# Patient Record
Sex: Male | Born: 1960
Health system: Southern US, Community
[De-identification: ages and names within clinical notes are randomized; demographics above are authoritative.]

## PROBLEM LIST (undated history)

## (undated) DIAGNOSIS — D689 Coagulation defect, unspecified: Secondary | ICD-10-CM

## (undated) DIAGNOSIS — G039 Meningitis, unspecified: Secondary | ICD-10-CM

## (undated) DIAGNOSIS — R011 Cardiac murmur, unspecified: Secondary | ICD-10-CM

## (undated) DIAGNOSIS — I10 Essential (primary) hypertension: Secondary | ICD-10-CM

## (undated) DIAGNOSIS — Q210 Ventricular septal defect: Secondary | ICD-10-CM

## (undated) HISTORY — DX: Cardiac murmur, unspecified: R01.1

## (undated) HISTORY — DX: Coagulation defect, unspecified: D68.9

## (undated) HISTORY — DX: Meningitis, unspecified: G03.9

---

## 2001-03-07 ENCOUNTER — Emergency Department (HOSPITAL_COMMUNITY): Admission: EM | Admit: 2001-03-07 | Discharge: 2001-03-07 | Payer: Self-pay | Admitting: Emergency Medicine

## 2001-11-15 ENCOUNTER — Emergency Department (HOSPITAL_COMMUNITY): Admission: EM | Admit: 2001-11-15 | Discharge: 2001-11-15 | Payer: Self-pay | Admitting: *Deleted

## 2004-05-02 ENCOUNTER — Ambulatory Visit: Payer: Self-pay | Admitting: Psychiatry

## 2005-03-25 ENCOUNTER — Ambulatory Visit: Payer: Self-pay | Admitting: Internal Medicine

## 2005-03-25 ENCOUNTER — Observation Stay (HOSPITAL_COMMUNITY): Admission: EM | Admit: 2005-03-25 | Discharge: 2005-03-26 | Payer: Self-pay | Admitting: Emergency Medicine

## 2010-04-01 ENCOUNTER — Emergency Department (HOSPITAL_COMMUNITY): Admission: EM | Admit: 2010-04-01 | Discharge: 2010-04-01 | Payer: Self-pay | Admitting: Emergency Medicine

## 2011-01-18 NOTE — Discharge Summary (Signed)
NAME:  Bruce [name], Bruce [name] NO.:  [id number]   MEDICAL RECORD NO.:  [id number]          PATIENT TYPE:  INP   LOCATION:  3735                         FACILITY:  [facility]   PHYSICIAN:  Yetta Barre, M.D.  DATE OF BIRTH:  Feb 06, 1961   DATE OF ADMISSION:  2005/04/14  DATE OF DISCHARGE:  03/26/2005                                 DISCHARGE SUMMARY   DISCHARGE DIAGNOSES:  1.  Chest pain to rule out acute myocardial infarction.  2.  Gastroesophageal reflux disease.  3.  Tobacco abuse.   DISCHARGE MEDICATIONS:  1.  Nexium 40 mg p.o. daily 1/2 hour before meals.  2.  Aspirin 325 mg p.o. daily.   DISPOSITION:  Hospital followup with patient's primary care physician, Dr.  Montey Hora on April 04, 2005 at 11:30 a.m.   CONSULTATIONS:  None this admission.   PROCEDURE:  None.   IMAGING STUDIES:  1.  CT of the chest on 04/14/2005. No evidence of pulmonary embolism or      dissection seen. Lungs clear.  2.  Chest x-ray done on 2005/04/14. No active disease seen.   HISTORY OF PRESENT ILLNESS:  This is a 50 year old Caucasian man with  ventricular septal defect and history of tobacco use for 30 years presenting  to the emergency department by EMS. Is complaining of chest pain since 1 day  prior to admission. The pain started around 10:00 a.m. in the morning and  was about 5 out of 10. A stabbing, sharp kind of pain, radiating to the left  side of the chest and to the left arm. The pain increased in intensity  overnight to about 8 to 9 on a scale of 10. The patient went to [facility] where he was given 2 nitroglycerin tablets  and EMS was called. On the way, he got another nitroglycerin sublingually.  By the time he got to [facility] emergency room, his pain had  decreased to just chest discomfort. On note, the patient has had similar  complaints in the past but none as severe.   ALLERGIES:  No known drug allergies.   PAST  MEDICAL HISTORY:  1.  VSD since birth.  2.  Meningitis when the patient was 50 years old.  3.  Endoscopy done in 2003 showing acid reflux disease. The patient was put      on Nexium then. However, the patient is non-compliant and does not take      the medication.   MEDICATIONS:  No home medications.   SOCIAL HISTORY:  The patient is a current smoker, smoking 2 packs per day  for the past 30 years. He does not drink alcohol and has no history of  illicit drug use. He is married and his education is to the 10th grade. He  was a public work Interior and spatial designer. He has [employer] and he lives with his  wife at home.   FAMILY HISTORY:  Mother is alive and healthy. Father is healthy. One brother  has lung cancer. He has 1 son and 1 daughter who died  as a preemie. Both his  grandmother and grandfather died of a heart attack.   REVIEW OF SYSTEMS:  The only thing that the patient has is joint pain.   PHYSICAL EXAMINATION:  VITAL SIGNS:  Temperature 98.3. Blood pressure  145/60. Pulse 75. Respiratory rate 16. O2 saturation is 95% on room air.  GENERAL:  The patient in no acute distress.  HEENT:  Eyes, pupils are equal, round, and reactive to light and  accommodation. Extraocular movements intact. Oropharynx clear. Moist mucous  membranes.  NECK:  Supple. No lymphadenopathy. Clear to auscultation bilaterally. No  wheezes or rhonchi.  HEART:  A holosystolic murmur was heard.  ABDOMEN:  Soft. No distention or tenderness. Bowel sounds are present.  EXTREMITIES:  No clubbing, cyanosis, or edema.  SKIN:  No rashes or petechia.  NEUROLOGIC:  The patient  was alert and oriented times three. Cranial nerves  2-12 are intact. Strength 5/5 bilaterally and symmetric. Tone normal and  bilaterally symmetric. Sensation is intact to pin prick. Finger to nose  finger test was normal and deep tendon reflexes 2+ bilaterally.   LABORATORY DATA:  Sodium 138, potassium 4.0, chloride 109, bicarb 24.1, BUN  12,  creatinine 0.8, glucose 132, hemoglobin 15.5, hematocrit 45.1, MCV 83.9.  White blood cell count 7.9. ANC 5.6, platelets 200,000. Urine drug screen  was negative for any substance. TSH was 1.837. Cardiac markers, the first  one, CK 100, CK MB 0.6, troponin 0.01. The next set of markers showed CK  101, CK MB 0.6, and troponin 0.01. Lipid profile:  Cholesterol 155, TG 161,  HDL 40, LDL 83, VLDL 32.   Electrocardiogram normal.   HOSPITAL COURSE BY PROBLEM:  1.  CHEST PAIN:  Chest pain, ruled out for acute myocardial infarction by      CK's and troponin being normal range and there were no changes on the      electrocardiogram. No chest pain since admission and there were no      changes on telemetry. CT of chest also showed no evidence of pulmonary      embolism or dissection and on x-ray, it was shown that there was no      active disease. The lungs were clear. After the initial workup, we      suspect that this may be gastrointestinal in origin, since the patient      has a history of gastroesophageal reflux disease by endoscopy and not on      any current treatment. The plan is to discharge the patient on a PPI,      that is Nexium and to followup with his primary care physician.   1.  GASTROESOPHAGEAL REFLUX DISEASE:  The patient was discharged on Nexium      40 mg p.o. daily.   1.  TOBACCO ABUSE:  A smoking cessation consultation was called and the      patient said that he was not interested in smoking cessation.   DISCHARGE LABORATORY DATA:  Sodium 139, potassium 4.1, chloride 107, bicarb  23, BUN 9, creatinine 0.9, glucose 87, white cell count 11.4, hemoglobin  15.2, hematocrit 45, and platelets 206,000.   DISCHARGE VITAL SIGNS:  Temperature 98.6, respiratory rate 20, pulse 62,  blood pressure 109/64. O2 saturation 97% on room air.       SS/MEDQ  D:  03/28/2005  T:  03/29/2005  Job:  [id number]   cc:   Montey Hora, M.D.  [place], [place]

## 2017-03-21 ENCOUNTER — Encounter: Payer: Self-pay | Admitting: Family

## 2017-03-21 ENCOUNTER — Encounter (INDEPENDENT_AMBULATORY_CARE_PROVIDER_SITE_OTHER): Payer: Self-pay

## 2017-03-21 ENCOUNTER — Ambulatory Visit (INDEPENDENT_AMBULATORY_CARE_PROVIDER_SITE_OTHER): Payer: BLUE CROSS/BLUE SHIELD | Admitting: Family

## 2017-03-21 VITALS — BP 156/88 | HR 66 | Temp 97.7°F | Ht 71.0 in | Wt 243.0 lb

## 2017-03-21 DIAGNOSIS — Z1211 Encounter for screening for malignant neoplasm of colon: Secondary | ICD-10-CM

## 2017-03-21 DIAGNOSIS — Z114 Encounter for screening for human immunodeficiency virus [HIV]: Secondary | ICD-10-CM

## 2017-03-21 DIAGNOSIS — F17200 Nicotine dependence, unspecified, uncomplicated: Secondary | ICD-10-CM | POA: Insufficient documentation

## 2017-03-21 DIAGNOSIS — Z1159 Encounter for screening for other viral diseases: Secondary | ICD-10-CM

## 2017-03-21 DIAGNOSIS — J019 Acute sinusitis, unspecified: Secondary | ICD-10-CM

## 2017-03-21 DIAGNOSIS — Z0000 Encounter for general adult medical examination without abnormal findings: Secondary | ICD-10-CM

## 2017-03-21 DIAGNOSIS — E669 Obesity, unspecified: Secondary | ICD-10-CM | POA: Insufficient documentation

## 2017-03-21 MED ORDER — AMOXICILLIN-POT CLAVULANATE 875-125 MG PO TABS: 1.0000 | ORAL_TABLET | Freq: Two times a day (BID) | ORAL | 0 refills | Status: DC

## 2017-03-21 NOTE — Progress Notes (Signed)
Subjective:    Patient ID: Bruce [name], male    DOB: 1961/02/01, 56 y.o.   MRN: [id number]  Pt presents to the office today to establish care and CPE. Pt is complaining of sinus pressure for over the last week.  Sinusitis  This is a new problem. The current episode started 1 to 4 weeks ago. The problem has been gradually worsening since onset. There has been no fever. The pain is mild. Associated symptoms include ear pain, headaches and sinus pressure. Pertinent negatives include no chills, congestion, coughing, sneezing or sore throat. Past treatments include oral decongestants. The treatment provided mild relief.     Review of Systems  Constitutional: Negative for chills.  HENT: Positive for ear pain and sinus pressure. Negative for congestion, sneezing and sore throat.   Respiratory: Negative for cough.   Neurological: Positive for headaches.  All other systems reviewed and are negative.  Social History   Social History  . Marital status: Married    Spouse name: N/A  . Number of children: N/A  . Years of education: N/A   Social History Main Topics  . Smoking status: Current Every Day Smoker  . Smokeless tobacco: Never Used  . Alcohol use No  . Drug use: No  . Sexual activity: Yes   Other Topics Concern  . None   Social History Narrative  . None    Family History  Problem Relation Age of Onset  . Arthritis Mother   . Hypertension Mother   . Hyperlipidemia Mother   . Cancer Mother        Brain tumor.  . Diabetes Mother   . Cancer Father   . Cancer Brother        Bone       Objective:   Physical Exam  Constitutional: He is oriented to person, place, and time. He appears well-developed and well-nourished. No distress.  HENT:  Head: Normocephalic.  Right Ear: External ear normal.  Left Ear: External ear normal.  Nose: Mucosal edema and rhinorrhea present. Right sinus exhibits frontal sinus tenderness. Left sinus exhibits frontal sinus tenderness.    Mouth/Throat: Posterior oropharyngeal erythema present.  Eyes: Pupils are equal, round, and reactive to light. Right eye exhibits no discharge. Left eye exhibits no discharge.  Neck: Normal range of motion. Neck supple. No thyromegaly present.  Cardiovascular: Normal rate, regular rhythm and intact distal pulses.   Murmur heard. Pulmonary/Chest: Effort normal and breath sounds normal. No respiratory distress. He has no wheezes.  Abdominal: Soft. Bowel sounds are normal. He exhibits no distension. There is no tenderness.  Musculoskeletal: Normal range of motion. He exhibits no edema or tenderness.  Neurological: He is alert and oriented to person, place, and time.  Skin: Skin is warm and dry. No rash noted. No erythema.  Psychiatric: He has a normal mood and affect. His behavior is normal. Judgment and thought content normal.  Vitals reviewed.    BP (!) 156/88   Pulse 66   Temp 97.7 F (36.5 C) (Oral)   Ht '5\' 11"'  (1.803 m)   Wt 243 lb (110.2 kg)   BMI 33.89 kg/m      Assessment & Plan:  1. Annual physical exam - Anemia Profile B - CMP14+EGFR - Lipid panel - TSH - VITAMIN D 25 Hydroxy (Vit-D Deficiency, Fractures) - Hepatitis C antibody - HIV antibody - PSA, total and free  2. Acute sinusitis, recurrence not specified, unspecified location - Take meds as prescribed - Use a  cool mist humidifier  -Use saline nose sprays frequently -Saline irrigations of the nose can be very helpful if done frequently.  * 4X daily for 1 week*  * Use of a nettie pot can be helpful with this. Follow directions with this* -Force fluids -For any cough or congestion  Use plain Mucinex- regular strength or max strength is fine   * Children- consult with Pharmacist for dosing -For fever or aces or pains- take tylenol or ibuprofen appropriate for age and weight.  * for fevers greater than 101 orally you may alternate ibuprofen and tylenol every  3 hours. -Throat lozenges if help -New toothbrush  in 3 days - CMP14+EGFR - amoxicillin-clavulanate (AUGMENTIN) 875-125 MG tablet; Take 1 tablet by mouth 2 (two) times daily.  Dispense: 14 tablet; Refill: 0  3. Current smoker - CMP14+EGFR  4. Obesity (BMI 30-39.9) - CMP14+EGFR  5. Need for hepatitis C screening test - CMP14+EGFR - Hepatitis C antibody  6. Encounter for screening for HIV - CMP14+EGFR - HIV antibody  7. Colon cancer screening - Fecal occult blood, imunochemical; Future   Continue all meds Labs pending Health Maintenance reviewed Diet and exercise encouraged RTO 1 year   Evelina Dun, FNP

## 2017-03-21 NOTE — Patient Instructions (Signed)

## 2017-03-22 LAB — LIPID PANEL
CHOLESTEROL TOTAL: 181 mg/dL (ref 100–199)
Chol/HDL Ratio: 4.5 ratio (ref 0.0–5.0)
HDL: 40 mg/dL (ref >39)
LDL Calculated: 90 mg/dL (ref 0–99)
Triglycerides: 253 mg/dL — ABNORMAL HIGH (ref 0–149)
VLDL Cholesterol Cal: 51 mg/dL — ABNORMAL HIGH (ref 5–40)

## 2017-03-22 LAB — CMP14+EGFR
A/G RATIO: 2 (ref 1.2–2.2)
ALBUMIN: 4.2 g/dL (ref 3.5–5.5)
ALK PHOS: 95 IU/L (ref 39–117)
ALT: 18 IU/L (ref 0–44)
AST: 17 IU/L (ref 0–40)
BILIRUBIN TOTAL: 0.2 mg/dL (ref 0.0–1.2)
BUN / CREAT RATIO: 11 (ref 9–20)
BUN: 10 mg/dL (ref 6–24)
CHLORIDE: 103 mmol/L (ref 96–106)
CO2: 25 mmol/L (ref 20–29)
Calcium: 9.1 mg/dL (ref 8.7–10.2)
Creatinine, Ser: 0.94 mg/dL (ref 0.76–1.27)
GFR calc non Af Amer: 91 mL/min/{1.73_m2} (ref >59)
GFR, EST AFRICAN AMERICAN: 105 mL/min/{1.73_m2} (ref >59)
GLOBULIN, TOTAL: 2.1 g/dL (ref 1.5–4.5)
Glucose: 100 mg/dL — ABNORMAL HIGH (ref 65–99)
Potassium: 4.1 mmol/L (ref 3.5–5.2)
SODIUM: 144 mmol/L (ref 134–144)
TOTAL PROTEIN: 6.3 g/dL (ref 6.0–8.5)

## 2017-03-22 LAB — ANEMIA PROFILE B
BASOS ABS: 0.1 10*3/uL (ref 0.0–0.2)
BASOS: 1 %
EOS (ABSOLUTE): 0.1 10*3/uL (ref 0.0–0.4)
EOS: 2 %
FERRITIN: 459 ng/mL — AB (ref 30–400)
FOLATE: 10.2 ng/mL (ref >3.0)
HEMATOCRIT: 46.5 % (ref 37.5–51.0)
Hemoglobin: 15.5 g/dL (ref 13.0–17.7)
IRON SATURATION: 33 % (ref 15–55)
IRON: 99 ug/dL (ref 38–169)
Immature Grans (Abs): 0 10*3/uL (ref 0.0–0.1)
Immature Granulocytes: 0 %
LYMPHS ABS: 2.5 10*3/uL (ref 0.7–3.1)
Lymphs: 32 %
MCH: 28.9 pg (ref 26.6–33.0)
MCHC: 33.3 g/dL (ref 31.5–35.7)
MCV: 87 fL (ref 79–97)
Monocytes Absolute: 0.6 10*3/uL (ref 0.1–0.9)
Monocytes: 7 %
NEUTROS ABS: 4.5 10*3/uL (ref 1.4–7.0)
Neutrophils: 58 %
PLATELETS: 182 10*3/uL (ref 150–379)
RBC: 5.36 x10E6/uL (ref 4.14–5.80)
RDW: 14.5 % (ref 12.3–15.4)
Retic Ct Pct: 2.1 % (ref 0.6–2.6)
TIBC: 301 ug/dL (ref 250–450)
UIBC: 202 ug/dL (ref 111–343)
VITAMIN B 12: 332 pg/mL (ref 232–1245)
WBC: 7.7 10*3/uL (ref 3.4–10.8)

## 2017-03-22 LAB — PSA, TOTAL AND FREE
PROSTATE SPECIFIC AG, SERUM: 0.4 ng/mL (ref 0.0–4.0)
PSA FREE: 0.13 ng/mL
PSA, Free Pct: 32.5 %

## 2017-03-22 LAB — VITAMIN D 25 HYDROXY (VIT D DEFICIENCY, FRACTURES): VIT D 25 HYDROXY: 28.8 ng/mL — AB (ref 30.0–100.0)

## 2017-03-22 LAB — TSH: TSH: 1.86 u[IU]/mL (ref 0.450–4.500)

## 2017-03-22 LAB — HIV ANTIBODY (ROUTINE TESTING W REFLEX): HIV Screen 4th Generation wRfx: NONREACTIVE

## 2017-03-22 LAB — HEPATITIS C ANTIBODY

## 2017-03-24 ENCOUNTER — Other Ambulatory Visit: Payer: Self-pay | Admitting: Family

## 2017-03-24 DIAGNOSIS — E559 Vitamin D deficiency, unspecified: Secondary | ICD-10-CM

## 2017-03-24 MED ORDER — VITAMIN D (ERGOCALCIFEROL) 1.25 MG (50000 UNIT) PO CAPS: 50000.0000 [IU] | ORAL_CAPSULE | ORAL | 3 refills | Status: DC

## 2017-04-01 ENCOUNTER — Telehealth: Payer: Self-pay | Admitting: Family

## 2017-04-02 NOTE — Telephone Encounter (Signed)
Pt's wife notified of lab results Verbalizes understanding 

## 2017-04-29 DIAGNOSIS — G8929 Other chronic pain: Secondary | ICD-10-CM | POA: Diagnosis not present

## 2017-04-29 DIAGNOSIS — M25562 Pain in left knee: Secondary | ICD-10-CM | POA: Diagnosis not present

## 2017-05-03 DIAGNOSIS — G8929 Other chronic pain: Secondary | ICD-10-CM | POA: Diagnosis not present

## 2017-05-03 DIAGNOSIS — M25562 Pain in left knee: Secondary | ICD-10-CM | POA: Diagnosis not present

## 2017-05-08 DIAGNOSIS — G8929 Other chronic pain: Secondary | ICD-10-CM | POA: Diagnosis not present

## 2017-05-08 DIAGNOSIS — M25562 Pain in left knee: Secondary | ICD-10-CM | POA: Diagnosis not present

## 2017-05-08 DIAGNOSIS — S83242A Other tear of medial meniscus, current injury, left knee, initial encounter: Secondary | ICD-10-CM | POA: Diagnosis not present

## 2017-07-01 ENCOUNTER — Ambulatory Visit (INDEPENDENT_AMBULATORY_CARE_PROVIDER_SITE_OTHER): Payer: BLUE CROSS/BLUE SHIELD | Admitting: Family Medicine

## 2017-07-01 VITALS — Temp 98.6°F | Ht 71.0 in | Wt 238.0 lb

## 2017-07-01 DIAGNOSIS — R030 Elevated blood-pressure reading, without diagnosis of hypertension: Secondary | ICD-10-CM

## 2017-07-01 DIAGNOSIS — Z024 Encounter for examination for driving license: Secondary | ICD-10-CM

## 2017-07-01 NOTE — Progress Notes (Signed)
Subjective:  Patient ID: Bruce [name], male    DOB: 04/07/1961  Age: 56 y.o. MRN: [id number]  CC: Private DOT Physical   HPI Bruce DickRichard K [name] presents for DOT physical evaluation  [facility] 07/01/2017 03/21/2017  Decreased Interest 0 0  Down, Depressed, Hopeless 0 0  PHQ - 2 Score 0 0    History Gergory has a past medical history of Clotting disorder (HCC); Heart murmur; and Meningitis.   He has no past surgical history on file.   His family history includes Arthritis in his mother; Cancer in his brother, father, and mother; Diabetes in his mother; Hyperlipidemia in his mother; Hypertension in his mother.He reports that he has been smoking.  He has never used smokeless tobacco. He reports that he does not drink alcohol or use drugs.    ROS Review of Systems  Constitutional: Negative for chills, diaphoresis, fever and unexpected weight change.  HENT: Negative for congestion, hearing loss, rhinorrhea and sore throat.   Eyes: Negative for visual disturbance.  Respiratory: Negative for cough and shortness of breath.   Cardiovascular: Negative for chest pain.  Gastrointestinal: Negative for abdominal pain, constipation and diarrhea.  Genitourinary: Negative for dysuria and flank pain.  Musculoskeletal: Negative for arthralgias and joint swelling.  Skin: Negative for rash.  Neurological: Negative for dizziness and headaches.  Psychiatric/Behavioral: Negative for dysphoric mood and sleep disturbance.    Objective:  Temp 98.6 F (37 C) (Oral)   Ht 5\' 11"  (1.803 m)   Wt 238 lb (108 kg)   BMI 33.19 kg/m   BP Readings from Last 3 Encounters:  03/21/17 (!) 156/88    Wt Readings from Last 3 Encounters:  07/01/17 238 lb (108 kg)  03/21/17 243 lb (110.2 kg)     Physical Exam  Constitutional: He is oriented to person, place, and time. He appears well-developed and well-nourished. No distress.  HENT:  Head: Normocephalic and atraumatic.  Right Ear:  External ear normal.  Left Ear: External ear normal.  Nose: Nose normal.  Mouth/Throat: Oropharynx is clear and moist.  Eyes: Pupils are equal, round, and reactive to light. Conjunctivae and EOM are normal.  Neck: Normal range of motion. Neck supple. No thyromegaly present.  Cardiovascular: Normal rate, regular rhythm and normal heart sounds.   No murmur heard. Pulmonary/Chest: Effort normal and breath sounds normal. No respiratory distress. He has no wheezes. He has no rales.  Abdominal: Soft. Bowel sounds are normal. He exhibits no distension. There is no tenderness.  Lymphadenopathy:    He has no cervical adenopathy.  Neurological: He is alert and oriented to person, place, and time. He has normal reflexes.  Skin: Skin is warm and dry.  Psychiatric: He has a normal mood and affect. His behavior is normal. Judgment and thought content normal.      Assessment & Plan:   Bruce BurdockRichard was seen today for private dot physical.  Diagnoses and all orders for this visit:  Encounter for Department of Transportation (DOT) examination for driving license renewal  Elevated blood pressure reading       I have discontinued Mr. Winferd Humphreyucker's amoxicillin-clavulanate. I am also having him maintain his Vitamin D (Ergocalciferol).  Allergies as of 07/01/2017   No Known Allergies     Medication List       Accurate as of 07/01/17  5:06 PM. Always use your most recent med list.          Vitamin D (Ergocalciferol) 50000 units Caps capsule Commonly  known as:  DRISDOL Take 1 capsule (50,000 Units total) by mouth every 7 (seven) days.      DASH diet recommended. Weight loss program encouraged with ultimate goal of 40 pounds.  Follow-up: Return in about 4 weeks (around 07/29/2017) for hypertension with Marin Shutter.  Mechele Claude, M.D.

## 2017-07-01 NOTE — Patient Instructions (Signed)
DASH Eating Plan DASH stands for "Dietary Approaches to Stop Hypertension." The DASH eating plan is a healthy eating plan that has been shown to reduce high blood pressure (hypertension). It may also reduce your risk for type 2 diabetes, heart disease, and stroke. The DASH eating plan may also help with weight loss. What are tips for following this plan? General guidelines  Avoid eating more than 2,300 mg (milligrams) of salt (sodium) a day. If you have hypertension, you may need to reduce your sodium intake to 1,500 mg a day.  Limit alcohol intake to no more than 1 drink a day for nonpregnant women and 2 drinks a day for men. One drink equals 12 oz of beer, 5 oz of wine, or 1 oz of hard liquor.  Work with your health care provider to maintain a healthy body weight or to lose weight. Ask what an ideal weight is for you.  Get at least 30 minutes of exercise that causes your heart to beat faster (aerobic exercise) most days of the week. Activities may include walking, swimming, or biking.  Work with your health care provider or diet and nutrition specialist (dietitian) to adjust your eating plan to your individual calorie needs. Reading food labels  Check food labels for the amount of sodium per serving. Choose foods with less than 5 percent of the Daily Value of sodium. Generally, foods with less than 300 mg of sodium per serving fit into this eating plan.  To find whole grains, look for the word "whole" as the first word in the ingredient list. Shopping  Buy products labeled as "low-sodium" or "no salt added."  Buy fresh foods. Avoid canned foods and premade or frozen meals. Cooking  Avoid adding salt when cooking. Use salt-free seasonings or herbs instead of table salt or sea salt. Check with your health care provider or pharmacist before using salt substitutes.  Do not fry foods. Cook foods using healthy methods such as baking, boiling, grilling, and broiling instead.  Cook with  heart-healthy oils, such as olive, canola, soybean, or sunflower oil. Meal planning   Eat a balanced diet that includes: ? 5 or more servings of fruits and vegetables each day. At each meal, try to fill half of your plate with fruits and vegetables. ? Up to 6-8 servings of whole grains each day. ? Less than 6 oz of lean meat, poultry, or fish each day. A 3-oz serving of meat is about the same size as a deck of cards. One egg equals 1 oz. ? 2 servings of low-fat dairy each day. ? A serving of nuts, seeds, or beans 5 times each week. ? Heart-healthy fats. Healthy fats called Omega-3 fatty acids are found in foods such as flaxseeds and coldwater fish, like sardines, salmon, and mackerel.  Limit how much you eat of the following: ? Canned or prepackaged foods. ? Food that is high in trans fat, such as fried foods. ? Food that is high in saturated fat, such as fatty meat. ? Sweets, desserts, sugary drinks, and other foods with added sugar. ? Full-fat dairy products.  Do not salt foods before eating.  Try to eat at least 2 vegetarian meals each week.  Eat more home-cooked food and less restaurant, buffet, and fast food.  When eating at a restaurant, ask that your food be prepared with less salt or no salt, if possible. What foods are recommended? The items listed may not be a complete list. Talk with your dietitian about what   dietary choices are best for you. Grains Whole-grain or whole-wheat bread. Whole-grain or whole-wheat pasta. Brown rice. Oatmeal. Quinoa. Bulgur. Whole-grain and low-sodium cereals. Pita bread. Low-fat, low-sodium crackers. Whole-wheat flour tortillas. Vegetables Fresh or frozen vegetables (raw, steamed, roasted, or grilled). Low-sodium or reduced-sodium tomato and vegetable juice. Low-sodium or reduced-sodium tomato sauce and tomato paste. Low-sodium or reduced-sodium canned vegetables. Fruits All fresh, dried, or frozen fruit. Canned fruit in natural juice (without  added sugar). Meat and other protein foods Skinless chicken or turkey. Ground chicken or turkey. Pork with fat trimmed off. Fish and seafood. Egg whites. Dried beans, peas, or lentils. Unsalted nuts, nut butters, and seeds. Unsalted canned beans. Lean cuts of beef with fat trimmed off. Low-sodium, lean deli meat. Dairy Low-fat (1%) or fat-free (skim) milk. Fat-free, low-fat, or reduced-fat cheeses. Nonfat, low-sodium ricotta or cottage cheese. Low-fat or nonfat yogurt. Low-fat, low-sodium cheese. Fats and oils Soft margarine without trans fats. Vegetable oil. Low-fat, reduced-fat, or light mayonnaise and salad dressings (reduced-sodium). Canola, safflower, olive, soybean, and sunflower oils. Avocado. Seasoning and other foods Herbs. Spices. Seasoning mixes without salt. Unsalted popcorn and pretzels. Fat-free sweets. What foods are not recommended? The items listed may not be a complete list. Talk with your dietitian about what dietary choices are best for you. Grains Baked goods made with fat, such as croissants, muffins, or some breads. Dry pasta or rice meal packs. Vegetables Creamed or fried vegetables. Vegetables in a cheese sauce. Regular canned vegetables (not low-sodium or reduced-sodium). Regular canned tomato sauce and paste (not low-sodium or reduced-sodium). Regular tomato and vegetable juice (not low-sodium or reduced-sodium). Pickles. Olives. Fruits Canned fruit in a light or heavy syrup. Fried fruit. Fruit in cream or butter sauce. Meat and other protein foods Fatty cuts of meat. Ribs. Fried meat. Bacon. Sausage. Bologna and other processed lunch meats. Salami. Fatback. Hotdogs. Bratwurst. Salted nuts and seeds. Canned beans with added salt. Canned or smoked fish. Whole eggs or egg yolks. Chicken or turkey with skin. Dairy Whole or 2% milk, cream, and half-and-half. Whole or full-fat cream cheese. Whole-fat or sweetened yogurt. Full-fat cheese. Nondairy creamers. Whipped toppings.  Processed cheese and cheese spreads. Fats and oils Butter. Stick margarine. Lard. Shortening. Ghee. Bacon fat. Tropical oils, such as coconut, palm kernel, or palm oil. Seasoning and other foods Salted popcorn and pretzels. Onion salt, garlic salt, seasoned salt, table salt, and sea salt. Worcestershire sauce. Tartar sauce. Barbecue sauce. Teriyaki sauce. Soy sauce, including reduced-sodium. Steak sauce. Canned and packaged gravies. Fish sauce. Oyster sauce. Cocktail sauce. Horseradish that you find on the shelf. Ketchup. Mustard. Meat flavorings and tenderizers. Bouillon cubes. Hot sauce and Tabasco sauce. Premade or packaged marinades. Premade or packaged taco seasonings. Relishes. Regular salad dressings. Where to find more information:  National Heart, Lung, and Blood Institute: www.nhlbi.nih.gov  American Heart Association: www.heart.org Summary  The DASH eating plan is a healthy eating plan that has been shown to reduce high blood pressure (hypertension). It may also reduce your risk for type 2 diabetes, heart disease, and stroke.  With the DASH eating plan, you should limit salt (sodium) intake to 2,300 mg a day. If you have hypertension, you may need to reduce your sodium intake to 1,500 mg a day.  When on the DASH eating plan, aim to eat more fresh fruits and vegetables, whole grains, lean proteins, low-fat dairy, and heart-healthy fats.  Work with your health care provider or diet and nutrition specialist (dietitian) to adjust your eating plan to your individual   calorie needs. This information is not intended to replace advice given to you by your health care provider. Make sure you discuss any questions you have with your health care provider. Document Released: 08/08/2011 Document Revised: 08/12/2016 Document Reviewed: 08/12/2016 Elsevier Interactive Patient Education  2017 Elsevier Inc.  

## 2017-09-03 ENCOUNTER — Telehealth: Payer: Self-pay | Admitting: Family

## 2017-09-03 NOTE — Telephone Encounter (Signed)
DOT physical 07-01-2017 [facility] ATTN: Archie Pattenonya [phone]

## 2017-10-03 DIAGNOSIS — R9431 Abnormal electrocardiogram [ECG] [EKG]: Secondary | ICD-10-CM | POA: Diagnosis not present

## 2017-10-03 DIAGNOSIS — R011 Cardiac murmur, unspecified: Secondary | ICD-10-CM | POA: Insufficient documentation

## 2017-10-03 DIAGNOSIS — Q210 Ventricular septal defect: Secondary | ICD-10-CM | POA: Diagnosis not present

## 2017-10-03 DIAGNOSIS — Z01818 Encounter for other preprocedural examination: Secondary | ICD-10-CM | POA: Diagnosis not present

## 2017-10-22 DIAGNOSIS — R931 Abnormal findings on diagnostic imaging of heart and coronary circulation: Secondary | ICD-10-CM | POA: Diagnosis not present

## 2017-10-22 DIAGNOSIS — Z01818 Encounter for other preprocedural examination: Secondary | ICD-10-CM | POA: Diagnosis not present

## 2017-10-22 DIAGNOSIS — Z01810 Encounter for preprocedural cardiovascular examination: Secondary | ICD-10-CM | POA: Diagnosis not present

## 2017-11-20 ENCOUNTER — Encounter: Payer: Self-pay | Admitting: Family

## 2017-11-20 ENCOUNTER — Ambulatory Visit: Payer: BLUE CROSS/BLUE SHIELD | Admitting: Family

## 2017-11-20 VITALS — BP 166/91 | HR 61 | Temp 97.0°F | Ht 71.0 in | Wt 234.2 lb

## 2017-11-20 DIAGNOSIS — I10 Essential (primary) hypertension: Secondary | ICD-10-CM

## 2017-11-20 DIAGNOSIS — F17200 Nicotine dependence, unspecified, uncomplicated: Secondary | ICD-10-CM | POA: Diagnosis not present

## 2017-11-20 DIAGNOSIS — J40 Bronchitis, not specified as acute or chronic: Secondary | ICD-10-CM

## 2017-11-20 DIAGNOSIS — J0110 Acute frontal sinusitis, unspecified: Secondary | ICD-10-CM

## 2017-11-20 MED ORDER — PREDNISONE 10 MG (21) PO TBPK: ORAL_TABLET | ORAL | 0 refills | Status: DC

## 2017-11-20 MED ORDER — DOXYCYCLINE HYCLATE 100 MG PO TABS: 100.0000 mg | ORAL_TABLET | Freq: Two times a day (BID) | ORAL | 0 refills | Status: DC

## 2017-11-20 MED ORDER — LISINOPRIL 20 MG PO TABS: 20.0000 mg | ORAL_TABLET | Freq: Every day | ORAL | 3 refills | Status: DC

## 2017-11-20 NOTE — Addendum Note (Signed)
Addended by: Jannifer RodneyHAWKS, Kenslie Abbruzzese A on: 11/20/2017 02:43 PM   Modules accepted: Orders

## 2017-11-20 NOTE — Progress Notes (Addendum)
Subjective:    Patient ID: Bruce [name], male    DOB: 07/13/1961, 57 y.o.   MRN: [id number]  PT presents to the office today with complaints of sinus problems, wheezing that started over a week ago. PT was scheduled for left knee surgery yesterday, but was postpone due to his wheezing.  Sinus Problem  This is a new problem. The current episode started in the past 7 days. The problem has been gradually worsening since onset. His pain is at a severity of 5/10. The pain is mild. Associated symptoms include congestion, coughing, headaches, a hoarse voice, sinus pressure and sneezing. Pertinent negatives include no chills, ear pain, shortness of breath or sore throat. Past treatments include oral decongestants and acetaminophen. The treatment provided mild relief.  Hypertension  This is a new problem. The current episode started more than 1 month ago. The problem has been waxing and waning since onset. The problem is uncontrolled. Associated symptoms include headaches and malaise/fatigue. Pertinent negatives include no peripheral edema or shortness of breath. Past treatments include nothing. The current treatment provides no improvement. There is no history of kidney disease, CAD/MI or CVA.      Review of Systems  Constitutional: Positive for malaise/fatigue. Negative for chills.  HENT: Positive for congestion, hoarse voice, sinus pressure and sneezing. Negative for ear pain and sore throat.   Respiratory: Positive for cough. Negative for shortness of breath.   Neurological: Positive for headaches.  All other systems reviewed and are negative.      Objective:   Physical Exam  Constitutional: He is oriented to person, place, and time. He appears well-developed and well-nourished. No distress.  HENT:  Head: Normocephalic.  Right Ear: External ear normal.  Left Ear: External ear normal.  Nose: Mucosal edema and rhinorrhea present. Right sinus exhibits frontal sinus tenderness. Left sinus  exhibits frontal sinus tenderness.  Mouth/Throat: Posterior oropharyngeal erythema present.  Eyes: Pupils are equal, round, and reactive to light. Right eye exhibits no discharge. Left eye exhibits no discharge.  Neck: Normal range of motion. Neck supple. No thyromegaly present.  Cardiovascular: Normal rate, regular rhythm and intact distal pulses.  Murmur heard. Pulmonary/Chest: Effort normal. No respiratory distress. He has wheezes.  Abdominal: Soft. Bowel sounds are normal. He exhibits no distension. There is no tenderness.  Musculoskeletal: Normal range of motion. He exhibits no edema or tenderness.  Neurological: He is alert and oriented to person, place, and time. He has normal reflexes. No cranial nerve deficit.  Skin: Skin is warm and dry. No rash noted. No erythema.  Psychiatric: He has a normal mood and affect. His behavior is normal. Judgment and thought content normal.  Vitals reviewed.    BP (!) 166/91   Pulse 61   Temp (!) 97 F (36.1 C)   Ht 5\' 11"  (1.803 m)   Wt 234 lb 3.2 oz (106.2 kg)   SpO2 97% Comment: resting on room air  BMI 32.66 kg/m      Assessment & Plan:  1. Acute frontal sinusitis, recurrence not specified - Take meds as prescribed - Use a cool mist humidifier  -Use saline nose sprays frequently -Force fluids -For any cough or congestion  Use plain Mucinex- regular strength or max strength is fine -For fever or aces or pains- take tylenol or ibuprofen. -Throat lozenges if help -New toothbrush in 3 days - doxycycline (VIBRA-TABS) 100 MG tablet; Take 1 tablet (100 mg total) by mouth 2 (two) times daily.  Dispense: 20 tablet; Refill:  0 - predniSONE (STERAPRED UNI-PAK 21 TAB) 10 MG (21) TBPK tablet; Use as directed  Dispense: 21 tablet; Refill: 0  2. Current smoker Smoking cessation - predniSONE (STERAPRED UNI-PAK 21 TAB) 10 MG (21) TBPK tablet; Use as directed  Dispense: 21 tablet; Refill: 0  3. Bronchitis - Take meds as prescribed - Use a cool  mist humidifier  -Use saline nose sprays frequently -Force fluids -For any cough or congestion  Use plain Mucinex- regular strength or max strength is fine -For fever or aces or pains- take tylenol or ibuprofen. -Throat lozenges if help -New toothbrush in 3 days - doxycycline (VIBRA-TABS) 100 MG tablet; Take 1 tablet (100 mg total) by mouth 2 (two) times daily.  Dispense: 20 tablet; Refill: 0 - predniSONE (STERAPRED UNI-PAK 21 TAB) 10 MG (21) TBPK tablet; Use as directed  Dispense: 21 tablet; Refill: 0   4. Essential hypertension Will start Lisinopril 20 mg  -Dash diet information given -Exercise encouraged - Stress Management  -Continue current meds -RTO in 2 weeks - lisinopril (PRINIVIL,ZESTRIL) 20 MG tablet; Take 1 tablet (20 mg total) by mouth daily.  Dispense: 90 tablet; Refill: 3   [name], FNP

## 2017-11-20 NOTE — Patient Instructions (Signed)

## 2017-12-04 ENCOUNTER — Encounter: Payer: Self-pay | Admitting: Family

## 2017-12-04 ENCOUNTER — Ambulatory Visit: Payer: BLUE CROSS/BLUE SHIELD | Admitting: Family

## 2017-12-04 ENCOUNTER — Ambulatory Visit (INDEPENDENT_AMBULATORY_CARE_PROVIDER_SITE_OTHER): Payer: BLUE CROSS/BLUE SHIELD

## 2017-12-04 VITALS — BP 143/76 | HR 66 | Temp 97.6°F | Ht 71.0 in | Wt 232.0 lb

## 2017-12-04 DIAGNOSIS — E669 Obesity, unspecified: Secondary | ICD-10-CM

## 2017-12-04 DIAGNOSIS — R739 Hyperglycemia, unspecified: Secondary | ICD-10-CM | POA: Diagnosis not present

## 2017-12-04 DIAGNOSIS — F17200 Nicotine dependence, unspecified, uncomplicated: Secondary | ICD-10-CM | POA: Diagnosis not present

## 2017-12-04 DIAGNOSIS — Z23 Encounter for immunization: Secondary | ICD-10-CM | POA: Diagnosis not present

## 2017-12-04 DIAGNOSIS — Z01818 Encounter for other preprocedural examination: Secondary | ICD-10-CM | POA: Diagnosis not present

## 2017-12-04 DIAGNOSIS — I10 Essential (primary) hypertension: Secondary | ICD-10-CM | POA: Diagnosis not present

## 2017-12-04 LAB — CBC WITH DIFFERENTIAL/PLATELET
BASOS ABS: 0.1 10*3/uL (ref 0.0–0.2)
Basos: 2 %
EOS (ABSOLUTE): 0.2 10*3/uL (ref 0.0–0.4)
Eos: 3 %
Hematocrit: 46.5 % (ref 37.5–51.0)
Hemoglobin: 15.7 g/dL (ref 13.0–17.7)
IMMATURE GRANS (ABS): 0 10*3/uL (ref 0.0–0.1)
IMMATURE GRANULOCYTES: 0 %
LYMPHS: 26 %
Lymphocytes Absolute: 1.8 10*3/uL (ref 0.7–3.1)
MCH: 28.6 pg (ref 26.6–33.0)
MCHC: 33.8 g/dL (ref 31.5–35.7)
MCV: 85 fL (ref 79–97)
Monocytes Absolute: 0.6 10*3/uL (ref 0.1–0.9)
Monocytes: 9 %
NEUTROS PCT: 60 %
Neutrophils Absolute: 4.2 10*3/uL (ref 1.4–7.0)
PLATELETS: 159 10*3/uL (ref 150–379)
RBC: 5.49 x10E6/uL (ref 4.14–5.80)
RDW: 15.1 % (ref 12.3–15.4)
WBC: 6.9 10*3/uL (ref 3.4–10.8)

## 2017-12-04 LAB — CMP14+EGFR
A/G RATIO: 2.5 — AB (ref 1.2–2.2)
ALK PHOS: 98 IU/L (ref 39–117)
ALT: 9 IU/L (ref 0–44)
AST: 13 IU/L (ref 0–40)
Albumin: 4.3 g/dL (ref 3.5–5.5)
BILIRUBIN TOTAL: 0.5 mg/dL (ref 0.0–1.2)
BUN/Creatinine Ratio: 11 (ref 9–20)
BUN: 11 mg/dL (ref 6–24)
CHLORIDE: 101 mmol/L (ref 96–106)
CO2: 23 mmol/L (ref 20–29)
Calcium: 8.8 mg/dL (ref 8.7–10.2)
Creatinine, Ser: 1 mg/dL (ref 0.76–1.27)
GFR calc Af Amer: 97 mL/min/{1.73_m2} (ref >59)
GFR calc non Af Amer: 84 mL/min/{1.73_m2} (ref >59)
Globulin, Total: 1.7 g/dL (ref 1.5–4.5)
Glucose: 136 mg/dL — ABNORMAL HIGH (ref 65–99)
Potassium: 3.8 mmol/L (ref 3.5–5.2)
Sodium: 139 mmol/L (ref 134–144)
Total Protein: 6 g/dL (ref 6.0–8.5)

## 2017-12-04 MED ORDER — LISINOPRIL-HYDROCHLOROTHIAZIDE 20-12.5 MG PO TABS: 1.0000 | ORAL_TABLET | Freq: Every day | ORAL | 3 refills | Status: DC

## 2017-12-04 NOTE — Progress Notes (Signed)
   Subjective:    Patient ID: Bruce [name], male    DOB: Nov 18, 1960, 57 y.o.   MRN: [id number]  HPI PT presents to the office today for surgical clearance for left knee surgery. Pt is followed by [employer] [facility] [employer] . PT reports constant 6 out 10 that is worse the more he walks on it.  Pt states he rests, but does not take any medications for the pain.   His BP is slightly elevated today. He reports he has taken his Lisinopril yesterday around noon.   PT was cleared by Cardiologists for surgery. His EF is 60-65%.    Review of Systems  Musculoskeletal: Positive for arthralgias.  All other systems reviewed and are negative.      Objective:   Physical Exam  Constitutional: He is oriented to person, place, and time. He appears well-developed and well-nourished. No distress.  HENT:  Head: Normocephalic.  Right Ear: External ear normal.  Left Ear: External ear normal.  Nose: Mucosal edema and rhinorrhea present.  Mouth/Throat: Posterior oropharyngeal erythema present.  Eyes: Pupils are equal, round, and reactive to light. Right eye exhibits no discharge. Left eye exhibits no discharge.  Neck: Normal range of motion. Neck supple. No thyromegaly present.  Cardiovascular: Normal rate, regular rhythm and intact distal pulses.  Murmur heard. Pulmonary/Chest: Effort normal and breath sounds normal. No respiratory distress. He has no wheezes.  Abdominal: Soft. Bowel sounds are normal. He exhibits no distension. There is no tenderness.  Musculoskeletal: Normal range of motion. He exhibits tenderness. He exhibits no edema.  Neurological: He is alert and oriented to person, place, and time.  Skin: Skin is warm and dry. No rash noted. No erythema.  Psychiatric: He has a normal mood and affect. His behavior is normal. Judgment and thought content normal.  Vitals reviewed.    BP (!) 143/76   Pulse 66   Temp 97.6 F (36.4 C) (Oral)   Ht '5\' 11"'$  (1.803 m)   Wt 232 lb (105.2 kg)   BMI  32.36 kg/m      Assessment & Plan:  1. Pre-op evaluation - EKG 12-Lead - DG Chest 2 View; Future - CMP14+EGFR - CBC with Differential/Platelet  2. Current smoker Smoking cessation discussed  3. Obesity (BMI 30-39.9)   4. Essential hypertension Will add HCTZ 12.5 mg today -Dash diet information given -Exercise encouraged - Stress Management  -Continue current meds -RTO in 2 weeks  - lisinopril-hydrochlorothiazide (ZESTORETIC) 20-12.5 MG tablet; Take 1 tablet by mouth daily.  Dispense: 90 tablet; Refill: 3  Continue all meds Labs pending Health Maintenance reviewed- TDAP given today Diet and exercise encouraged RTO in 2 weeks recheck HTN  Evelina Dun, FNP

## 2017-12-04 NOTE — Addendum Note (Signed)
Addended by: Almeta MonasSTONE, JANIE M on: 12/04/2017 09:27 AM   Modules accepted: Orders

## 2017-12-04 NOTE — Patient Instructions (Signed)
Knee Rehabilitation Guidelines Following Surgery After knee surgery, it is important to follow instructions from your health care provider about range-of-motion (ROM) and muscle strengthening exercises. This will improve your surgery results. If the exercises cause you to have pain or swelling in your knee joint, do them less often until you can do them without pain. Then, slowly increase how often you do your exercises. If you have problems or questions, talk with your health care provider or physical therapist. You should start exercising as soon as your health care provider or physical therapist says it is okay. Follow these instructions at home: Activity  Use your crutches orwalker as told by your health care provider.  Do not lift anything that is heavier than 10 lb (4.5 kg) and do not play contact sports until your health care provider says it is okay.  Return to your normal activities as told by your health care provider. Ask your health care provider what activities are safe for you.  Return to work as told by your health care provider.  Do not drive a car for six weeks or as told by your health care provider. General instructions  Take over-the-counter and prescription medicines only as told by your health care provider.  Protect your knee during the recovery period to keep it from getting injured again.  You may take sponge baths. Do not take showers or tub baths until your health care provider says it is okay.  Remove throw rugs and tripping hazards from the floor.  Wear elastic stockings for as long as your health care provider instructs you to.  Keep all follow-up visits as told by your health care provider. This is important. Range of motion and strengthening exercises Do your exercises as told by your health care provider or physical therapist. Before you exercise  Put a towel between your thigh and a heat pack or heating pad.  Leave the heat on your thigh muscle for  20-30 minutes before you exercise. Leg lifts While your knee is still in a splint or a cast, you can do straight-leg raises. Repeat this exercise 10-20 times, 2-3 times per day. As your knee gets better, do this exercise against resistance. 1. Lie flat on your back. 2. Lift the leg about 6 inches. Keep it raised for 3 seconds. 3. Slowly lower the leg.  Quad sets Repeat this exercise 10-20 times every hour. 1. Lie flat on your back. 2. Tighten your thigh muscle (quad). 3. Keep the muscle tight for 5-10 seconds.  Hamstring sets Repeat this exercise 10-20 times every hour. 1. Push your foot backward against an object that does not move. 2. Keep pushing your foot against it for 5-10 seconds.  Weight-resistance exercises Weight-resistance exercises are another important part of rehabilitation. These exercises strengthen your muscles by making them work against resistance. Examples include using:  Free weights.  Weight-lifting machines.  Resistance bands.  Aerobic exercises Aerobic exercise keeps joints and muscles moving. It involves large muscle groups. It is also rhythmic in nature and is done for a longer period. Doing these exercises improves circulation and endurance. Your health care provider may have you start by taking a 20-30 minute walk, 2 times per day. Examples of aerobic exercise include:  Swimming.  Walking.  Hiking.  Jogging.  Cross-country skiing.  Bike riding.  This information is not intended to replace advice given to you by your health care provider. Make sure you discuss any questions you have with your health care provider.  Document Released: 08/19/2005 Document Revised: 04/23/2016 Document Reviewed: 08/15/2014 Elsevier Interactive Patient Education  [employer]2018 Elsevier Inc.

## 2017-12-05 NOTE — Addendum Note (Signed)
Addended by: Almeta MonasSTONE, Tyjanae Bartek M on: 12/05/2017 02:16 PM   Modules accepted: Orders

## 2017-12-12 ENCOUNTER — Other Ambulatory Visit: Payer: Self-pay | Admitting: Family

## 2017-12-18 ENCOUNTER — Encounter: Payer: Self-pay | Admitting: Family

## 2017-12-18 ENCOUNTER — Ambulatory Visit: Payer: BLUE CROSS/BLUE SHIELD | Admitting: Family

## 2017-12-18 DIAGNOSIS — R739 Hyperglycemia, unspecified: Secondary | ICD-10-CM | POA: Diagnosis not present

## 2017-12-18 DIAGNOSIS — I10 Essential (primary) hypertension: Secondary | ICD-10-CM | POA: Diagnosis not present

## 2017-12-18 LAB — BAYER DCA HB A1C WAIVED: HB A1C: 5.7 % (ref <7.0)

## 2017-12-18 NOTE — Progress Notes (Signed)
   Subjective:    Patient ID: Bruce [name], male    DOB: [birth year], 57 y.o.   MRN: [id number]  PT presents to the office today to recheck HTN. PT's BP is at goal today! Hypertension  This is a chronic problem. The current episode started more than 1 year ago. The problem has been resolved since onset. The problem is controlled. Pertinent negatives include no headaches, malaise/fatigue, peripheral edema or shortness of breath. There is no history of kidney disease, CAD/MI, CVA or heart failure.      Review of Systems  Constitutional: Negative for malaise/fatigue.  Respiratory: Negative for shortness of breath.   Neurological: Negative for headaches.  All other systems reviewed and are negative.      Objective:   Physical Exam  Constitutional: He is oriented to person, place, and time. He appears well-developed and well-nourished. No distress.  HENT:  Head: Normocephalic.  Right Ear: External ear normal.  Left Ear: External ear normal.  Nose: Nose normal.  Mouth/Throat: Oropharynx is clear and moist.  Eyes: Pupils are equal, round, and reactive to light. Right eye exhibits no discharge. Left eye exhibits no discharge.  Neck: Normal range of motion. Neck supple. No thyromegaly present.  Cardiovascular: Normal rate, regular rhythm and intact distal pulses.  Murmur heard. Pulmonary/Chest: Effort normal and breath sounds normal. No respiratory distress. He has no wheezes.  Abdominal: Soft. Bowel sounds are normal. He exhibits no distension. There is no tenderness.  Musculoskeletal: Normal range of motion. He exhibits no edema or tenderness.  Neurological: He is alert and oriented to person, place, and time.  Skin: Skin is warm and dry. No rash noted. No erythema.  Psychiatric: He has a normal mood and affect. His behavior is normal. Judgment and thought content normal.  Vitals reviewed.     BP 136/86   Pulse 68   Temp (!) 97.5 F (36.4 C) (Oral)   Ht '5\' 11"'$  (1.803 m)   Wt  231 lb 12.8 oz (105.1 kg)   BMI 32.33 kg/m      Assessment & Plan:  1. Essential hypertension -Daily blood pressure log given with instructions on how to fill out and told to bring to next visit -Dash diet information given -Exercise encouraged - Stress Management  -Continue current meds -RTO in 6 months - BMP8+EGFR  2. Elevated blood sugar level - Bayer DCA Hb A1c Waived    Evelina Dun, [place]

## 2017-12-18 NOTE — Patient Instructions (Signed)
Colorectal Cancer Screening Colorectal cancer screening is a group of tests used to check for colorectal cancer. Colorectal refers to your colon and rectum. Your colon and rectum are located at the end of your large intestine and carry your bowel movements out of your body. Why is colorectal cancer screening done? It is common for abnormal growths (polyps) to form in the lining of your colon, especially as you get older. These polyps can be cancerous or become cancerous. If colorectal cancer is found at an early stage, it is treatable. Who should be screened for colorectal cancer? Screening is recommended for all adults at average risk starting at age 50. Tests may be recommended every 1 to 10 years. Your health care provider may recommend earlier or more frequent screening if you have:  A history of colorectal cancer or polyps.  A family member with a history of colorectal cancer or polyps.  Inflammatory bowel disease, such as ulcerative colitis or Crohn disease.  A type of hereditary colon cancer syndrome.  Colorectal cancer symptoms.  Types of screening tests There are several types of colorectal screening tests. They include:  Guaiac-based fecal occult blood testing.  Fecal immunochemical test (FIT).  Stool DNA test.  Barium enema.  Virtual colonoscopy.  Sigmoidoscopy. During this test, a sigmoidoscope is used to examine your rectum and lower colon. A sigmoidoscope is a flexible tube with a camera that is inserted through your anus into your rectum and lower colon.  Colonoscopy. During this test, a colonoscope is used to examine your entire colon. A colonoscope is a long, thin, flexible tube with a camera. This test examines your entire colon and rectum.  This information is not intended to replace advice given to you by your health care provider. Make sure you discuss any questions you have with your health care provider. Document Released: 02/06/2010 Document Revised:  03/28/2016 Document Reviewed: 11/25/2013 Elsevier Interactive Patient Education  2018 Elsevier Inc.  

## 2017-12-19 LAB — BMP8+EGFR
BUN / CREAT RATIO: 13 (ref 9–20)
BUN: 13 mg/dL (ref 6–24)
CHLORIDE: 101 mmol/L (ref 96–106)
CO2: 25 mmol/L (ref 20–29)
CREATININE: 1.02 mg/dL (ref 0.76–1.27)
Calcium: 9 mg/dL (ref 8.7–10.2)
GFR calc Af Amer: 95 mL/min/{1.73_m2} (ref >59)
GFR calc non Af Amer: 82 mL/min/{1.73_m2} (ref >59)
Glucose: 90 mg/dL (ref 65–99)
Potassium: 3.9 mmol/L (ref 3.5–5.2)
SODIUM: 140 mmol/L (ref 134–144)

## 2017-12-22 NOTE — Pre-Procedure Instructions (Signed)
Bruce [name]  12/22/2017      [id number] - [place], [place] - [address] [place] [address] Phone: [phone] Fax: [phone]    Your procedure is scheduled on 12/31/2017.  [facility] at 1:15 P.M.  Call this number if you have problems the morning of surgery:  [phone]   Remember:  Do not eat food or drink liquids after midnight.   Continue all medications as directed by your physician except follow these medication instructions before surgery below   Take these medicines the morning of surgery with A SIP OF WATER: NONE   Do not wear jewelry.  Do not wear lotions, powders, or colognes, or deodorant.  Do not shave 48 hours prior to surgery.  Men may shave face and neck.  Do not bring valuables to the hospital.  [facility] is not responsible for any belongings or valuables.  Hearing aids, eyeglasses, contacts, dentures or bridgework may not be worn into surgery.  Leave your suitcase in the car.  After surgery it may be brought to your room.  For patients admitted to the hospital, discharge time will be determined by your treatment team.  Patients discharged the day of surgery will not be allowed to drive home.   Name and phone number of your driver:    Special instructions:   [facility]- Preparing For Surgery  Before surgery, you can play an important role. Because skin is not sterile, your skin needs to be as free of germs as possible. You can reduce the number of germs on your skin by washing with CHG (chlorahexidine gluconate) Soap before surgery.  CHG is an antiseptic cleaner which kills germs and bonds with the skin to continue killing germs even after washing.  Please do not use if you have an allergy to CHG or antibacterial soaps. If your skin becomes reddened/irritated stop using the CHG.  Do not shave (including legs and underarms) for at least 48 hours prior to first CHG shower. It  is OK to shave your face.  Please follow these instructions carefully.   1. Shower the NIGHT BEFORE SURGERY and the MORNING OF SURGERY with CHG.   2. If you chose to wash your hair, wash your hair first as usual with your normal shampoo.  3. After you shampoo, rinse your hair and body thoroughly to remove the shampoo.  4. Use CHG as you would any other liquid soap. You can apply CHG directly to the skin and wash gently with a scrungie or a clean washcloth.   5. Apply the CHG Soap to your body ONLY FROM THE NECK DOWN.  Do not use on open wounds or open sores. Avoid contact with your eyes, ears, mouth and genitals (private parts). Wash Face and genitals (private parts)  with your normal soap.  6. Wash thoroughly, paying special attention to the area where your surgery will be performed.  7. Thoroughly rinse your body with warm water from the neck down.  8. DO NOT shower/wash with your normal soap after using and rinsing off the CHG Soap.  9. Pat yourself dry with a CLEAN TOWEL.  10. Wear CLEAN PAJAMAS to bed the night before surgery, wear comfortable clothes the morning of surgery  11. Place CLEAN SHEETS on your bed the night of your first shower and DO NOT SLEEP WITH PETS.    Day of Surgery: Shower as stated above. Do not apply any  deodorants/lotions.  Please wear clean clothes to the hospital/surgery center.      Please read over the following fact sheets that you were given.

## 2017-12-23 ENCOUNTER — Encounter (HOSPITAL_COMMUNITY): Payer: Self-pay

## 2017-12-23 ENCOUNTER — Encounter (HOSPITAL_COMMUNITY)
Admission: RE | Admit: 2017-12-23 | Discharge: 2017-12-23 | Disposition: A | Discharge status: Home / Self Care | Payer: BLUE CROSS/BLUE SHIELD | Source: Ambulatory Visit | Attending: Orthopedic Surgery | Admitting: Orthopedic Surgery

## 2017-12-23 ENCOUNTER — Other Ambulatory Visit: Payer: Self-pay

## 2017-12-23 DIAGNOSIS — Z01812 Encounter for preprocedural laboratory examination: Secondary | ICD-10-CM | POA: Insufficient documentation

## 2017-12-23 HISTORY — DX: Essential (primary) hypertension: I10

## 2017-12-23 HISTORY — DX: Ventricular septal defect: Q21.0

## 2017-12-23 LAB — BASIC METABOLIC PANEL WITH GFR
Anion gap: 11 (ref 5–15)
BUN: 9 mg/dL (ref 6–20)
CO2: 26 mmol/L (ref 22–32)
Calcium: 8.9 mg/dL (ref 8.9–10.3)
Chloride: 102 mmol/L (ref 101–111)
Creatinine, Ser: 1.01 mg/dL (ref 0.61–1.24)
GFR calc Af Amer: >60 mL/min
GFR calc non Af Amer: >60 mL/min
Glucose, Bld: 87 mg/dL (ref 65–99)
Potassium: 3.4 mmol/L — ABNORMAL LOW (ref 3.5–5.1)
Sodium: 139 mmol/L (ref 135–145)

## 2017-12-23 LAB — CBC
HCT: 45.1 % (ref 39.0–52.0)
Hemoglobin: 14.9 g/dL (ref 13.0–17.0)
MCH: 28.3 pg (ref 26.0–34.0)
MCHC: 33 g/dL (ref 30.0–36.0)
MCV: 85.7 fL (ref 78.0–100.0)
Platelets: 205 K/uL (ref 150–400)
RBC: 5.26 MIL/uL (ref 4.22–5.81)
RDW: 13.7 % (ref 11.5–15.5)
WBC: 7.6 K/uL (ref 4.0–10.5)

## 2017-12-23 NOTE — Progress Notes (Signed)
   12/23/17 1341  OBSTRUCTIVE SLEEP APNEA  Have you ever been diagnosed with sleep apnea through a sleep study? No  Do you snore loudly (loud enough to be heard through closed doors)?  1  Do you often feel tired, fatigued, or sleepy during the daytime (such as falling asleep during driving or talking to someone)? 0  Has anyone observed you stop breathing during your sleep? 0  Do you have, or are you being treated for high blood pressure? 1  BMI more than 35 kg/m2? 0  Age > 50 (1-yes) 1  Neck circumference greater than:Male 16 inches or larger, Male 17inches or larger? 1  Male Gender (Yes=1) 1  Obstructive Sleep Apnea Score 5  Score 5 or greater  Results sent to PCP

## 2017-12-23 NOTE — Progress Notes (Signed)
PCP - Jannifer Rodneyhristy Hawks, FNP Cardiologist - Dr. Senaida Oresichardson  Chest x-ray - 12/04/2017 EKG - 12/04/2017 Stress Test - patient unsure ECHO - 10/2017 Cardiac Cath - patient denies but states his cardiologists says he has had [facility].  I do not see one in our system  Sleep Study - patient denies; + stop bang, sent to PCP   Blood Thinner Instructions: n/a Aspirin Instructions: n/a  Anesthesia review: yes, abnormal EKG;  Patient denies shortness of breath, fever, cough and chest pain at PAT appointment   Patient verbalized understanding of instructions that were given to them at the PAT appointment. Patient was also instructed that they will need to review over the PAT instructions again at home before surgery.

## 2017-12-23 NOTE — H&P (Signed)
   Bruce Bruce [name] is an 57 y.o. male.    Chief Complaint: left knee pain  HPI: Pt is Bruce [name] complaining of left knee pain for several months. Pain had continually increased since the beginning. X-rays in the clinic show meniscal tear left knee. Pt has tried various conservative treatments which have failed to alleviate their symptoms, including injections and therapy. Various options are discussed with the patient. Risks, benefits and expectations were discussed with the patient. Patient understand the risks, benefits and expectations and wishes to proceed with surgery.   PCP:  Bruce Bruce [name], Bruce Bruce [name], Bruce Bruce [name]  D/C Plans: Home  PMH: Past Medical History:  Diagnosis Date  . Clotting disorder (HCC)    Difficulty clotting blood after tooth extractions or lacerations.  . Heart murmur   . Meningitis    Had at age 57 years old.    PSH: No past surgical history on file.  Social History:  reports that he has been smoking.  He has never used smokeless tobacco. He reports that he does not drink alcohol or use drugs.  Allergies:  No Known Allergies  Medications: No current facility-administered medications for this encounter.    Current Outpatient Medications  Medication Sig Dispense Refill  . lisinopril-hydrochlorothiazide (ZESTORETIC) 20-12.5 MG tablet Take 1 tablet by mouth daily. 90 tablet 3    No results found for this or any previous visit (from the past 48 hour(s)). No results found.  ROS: Pain with rom of the left lower extremity  Physical Exam:  Alert and oriented 57 y.o. male in no acute distress Cranial nerves 2-12 intact Cervical spine: full rom with no tenderness, nv intact distally Chest: active breath sounds bilaterally, no wheeze rhonchi or rales Heart: regular rate and rhythm, no murmur Abd: non tender non distended with active bowel sounds Hip is stable with rom  Left knee with mild medial joint line tenderness nv intact distally Slight antalgic  gait  Assessment/Plan Assessment: left knee meniscal tear  Plan: Patient will undergo Bruce [name] left knee scope by [facility]ell [facility]Norris [facility]. Risks benefits and expectations were discussed with the patient. Patient understand risks, benefits and expectations and wishes to proceed.  Bruce OverallBrad Dixon PA-C, [facility]  [place] [address], [place], [place] [address] Phone: [phone] [email or web address] Facebook  [employer]  LinkedIn  Twitter

## 2017-12-24 ENCOUNTER — Encounter (HOSPITAL_COMMUNITY): Payer: Self-pay

## 2017-12-24 NOTE — Progress Notes (Signed)
Anesthesia Chart Review:   Case:  [id number] Date/Time:  12/31/17 1500   Procedure:  LEFT KNEE ARTHROSCOPY WITH MEDIAL MENISECTOMY (Left Knee)   Anesthesia type:  Monitor Anesthesia Care   Pre-op diagnosis:  Left knee medial meniscal tear   Location:  [facility]OM 03 / [facility]   Surgeon:  Beverely LowNorris, Steve, MD      DISCUSSION: Pt is a 57 year old male with a history of small restrictive VSD.  Pt has medical and cardiac clearance for surgery.    VS: BP 135/77   Pulse (!) 57   Temp 36.6 C   Resp 20   Ht 5\' 11"  (1.803 m)   Wt 231 lb 1.6 oz (104.8 kg)   SpO2 98%   BMI 32.23 kg/m   PROVIDERS: Junie SpencerHawks, Christy A, FNP Cardiologist is Christa SeeKarl Richardson, MD.  Last office visit 10/03/17; f/u in 2 years recommended (notes in care everywhere)    LABS: Labs reviewed: Acceptable for surgery. (all labs ordered are listed, but only abnormal results are displayed)  Labs Reviewed  BASIC METABOLIC PANEL - Abnormal; Notable for the following components:      Result Value   Potassium 3.4 (*)    All other components within normal limits  CBC     IMAGES:  CXR 12/04/17: No active cardiopulmonary disease.   EKG 12/04/17: Sinus  Rhythm. Nonspecific ST depression + Nonspecific T-abnormality  -Nondiagnostic.    CV:  Echo 10/22/17 (care everywhere): - LV size is normal. Normal LV wall thickness. LV systolic function is normal. LV ejection fraction = 60-65%. LV wall motion is normal.  - There is restrictive supracristal VSD seen - LV filling pattern is prolonged relaxation. - RV is normal size. - RV systolic function is normal. - LA is mildly dilated. - No significant valvular stenosis or regurgitation - No pericardial effusion. - Probably no significant change in comparison with the prior study noted   Past Medical History:  Diagnosis Date  . Clotting disorder (HCC)    Difficulty clotting blood after tooth extractions or lacerations.  . Heart murmur   . Hypertension   . Meningitis    Had at age 57  years old.  . VSD (ventricular septal defect), supracristal     History reviewed. No pertinent surgical history.  MEDICATIONS: . lisinopril-hydrochlorothiazide (ZESTORETIC) 20-12.5 MG tablet   No current facility-administered medications for this encounter.     If no changes, I anticipate pt can proceed with surgery as scheduled.   Rica Mastngela Josimar Corning, FNP-BC [facility] Phone: [phone] 12/24/2017 10:53 AM

## 2017-12-31 ENCOUNTER — Ambulatory Visit (HOSPITAL_COMMUNITY)
Admission: RE | Admit: 2017-12-31 | Discharge: 2017-12-31 | Disposition: A | Discharge status: Home / Self Care | Payer: BLUE CROSS/BLUE SHIELD | Source: Ambulatory Visit | Attending: Orthopedic Surgery | Admitting: Orthopedic Surgery

## 2017-12-31 ENCOUNTER — Encounter (HOSPITAL_COMMUNITY): Payer: Self-pay | Admitting: Certified Registered Nurse Anesthetist

## 2017-12-31 ENCOUNTER — Encounter (HOSPITAL_COMMUNITY)
Admission: RE | Disposition: A | Discharge status: Home / Self Care | Payer: Self-pay | Source: Ambulatory Visit | Attending: Orthopedic Surgery

## 2017-12-31 ENCOUNTER — Ambulatory Visit (HOSPITAL_COMMUNITY): Payer: BLUE CROSS/BLUE SHIELD | Admitting: Emergency Medicine

## 2017-12-31 ENCOUNTER — Ambulatory Visit (HOSPITAL_COMMUNITY): Payer: BLUE CROSS/BLUE SHIELD | Admitting: Certified Registered Nurse Anesthetist

## 2017-12-31 DIAGNOSIS — F17210 Nicotine dependence, cigarettes, uncomplicated: Secondary | ICD-10-CM | POA: Diagnosis not present

## 2017-12-31 DIAGNOSIS — I10 Essential (primary) hypertension: Secondary | ICD-10-CM | POA: Insufficient documentation

## 2017-12-31 DIAGNOSIS — M1990 Unspecified osteoarthritis, unspecified site: Secondary | ICD-10-CM | POA: Insufficient documentation

## 2017-12-31 DIAGNOSIS — Z79899 Other long term (current) drug therapy: Secondary | ICD-10-CM | POA: Insufficient documentation

## 2017-12-31 DIAGNOSIS — M23222 Derangement of posterior horn of medial meniscus due to old tear or injury, left knee: Secondary | ICD-10-CM | POA: Diagnosis not present

## 2017-12-31 DIAGNOSIS — Z6832 Body mass index (BMI) 32.0-32.9, adult: Secondary | ICD-10-CM | POA: Diagnosis not present

## 2017-12-31 DIAGNOSIS — S83242A Other tear of medial meniscus, current injury, left knee, initial encounter: Secondary | ICD-10-CM | POA: Diagnosis not present

## 2017-12-31 DIAGNOSIS — M94262 Chondromalacia, left knee: Secondary | ICD-10-CM | POA: Diagnosis not present

## 2017-12-31 HISTORY — PX: KNEE ARTHROSCOPY WITH MEDIAL MENISECTOMY: SHX5651

## 2017-12-31 SURGERY — ARTHROSCOPY, KNEE, WITH MEDIAL MENISCECTOMY
Anesthesia: General | Site: Knee | Laterality: Left

## 2017-12-31 MED ORDER — PROMETHAZINE HCL 25 MG/ML IJ SOLN: 6.2500 mg | INTRAMUSCULAR | Status: DC | PRN

## 2017-12-31 MED ORDER — CHLORHEXIDINE GLUCONATE 4 % EX LIQD: 60.0000 mL | Freq: Once | CUTANEOUS | Status: DC

## 2017-12-31 MED ORDER — PROPOFOL 10 MG/ML IV BOLUS
INTRAVENOUS | Status: AC
  Filled 2017-12-31: qty 20

## 2017-12-31 MED ORDER — EPHEDRINE SULFATE-NACL 50-0.9 MG/10ML-% IV SOSY
PREFILLED_SYRINGE | INTRAVENOUS | Status: DC | PRN
  Administered 2017-12-31 (×2): 10 mg via INTRAVENOUS

## 2017-12-31 MED ORDER — MEPERIDINE HCL 50 MG/ML IJ SOLN: 6.2500 mg | INTRAMUSCULAR | Status: DC | PRN

## 2017-12-31 MED ORDER — EPHEDRINE 5 MG/ML INJ
INTRAVENOUS | Status: AC
  Filled 2017-12-31: qty 10

## 2017-12-31 MED ORDER — LIDOCAINE 2% (20 MG/ML) 5 ML SYRINGE
INTRAMUSCULAR | Status: DC | PRN
  Administered 2017-12-31: 40 mg via INTRAVENOUS

## 2017-12-31 MED ORDER — KETOROLAC TROMETHAMINE 30 MG/ML IJ SOLN
INTRAMUSCULAR | Status: AC
  Filled 2017-12-31: qty 1

## 2017-12-31 MED ORDER — MIDAZOLAM HCL 5 MG/5ML IJ SOLN
INTRAMUSCULAR | Status: DC | PRN
  Administered 2017-12-31: 2 mg via INTRAVENOUS

## 2017-12-31 MED ORDER — CEFAZOLIN SODIUM-DEXTROSE 2-4 GM/100ML-% IV SOLN
2.0000 g | INTRAVENOUS | Status: AC
  Administered 2017-12-31: 2 g via INTRAVENOUS

## 2017-12-31 MED ORDER — LACTATED RINGERS IV SOLN
INTRAVENOUS | Status: DC
  Administered 2017-12-31 (×2): via INTRAVENOUS

## 2017-12-31 MED ORDER — FENTANYL CITRATE (PF) 100 MCG/2ML IJ SOLN
INTRAMUSCULAR | Status: DC | PRN
  Administered 2017-12-31 (×2): 25 ug via INTRAVENOUS
  Administered 2017-12-31: 50 ug via INTRAVENOUS
  Administered 2017-12-31: 25 ug via INTRAVENOUS

## 2017-12-31 MED ORDER — HYDROMORPHONE HCL 2 MG/ML IJ SOLN: 0.2500 mg | INTRAMUSCULAR | Status: DC | PRN

## 2017-12-31 MED ORDER — CEFAZOLIN SODIUM-DEXTROSE 2-4 GM/100ML-% IV SOLN
INTRAVENOUS | Status: AC
  Filled 2017-12-31: qty 100

## 2017-12-31 MED ORDER — ONDANSETRON HCL 4 MG/2ML IJ SOLN
INTRAMUSCULAR | Status: DC | PRN
  Administered 2017-12-31: 4 mg via INTRAVENOUS

## 2017-12-31 MED ORDER — 0.9 % SODIUM CHLORIDE (POUR BTL) OPTIME
TOPICAL | Status: DC | PRN
  Administered 2017-12-31: 1000 mL

## 2017-12-31 MED ORDER — LIDOCAINE 2% (20 MG/ML) 5 ML SYRINGE
INTRAMUSCULAR | Status: AC
  Filled 2017-12-31: qty 5

## 2017-12-31 MED ORDER — FENTANYL CITRATE (PF) 250 MCG/5ML IJ SOLN
INTRAMUSCULAR | Status: AC
  Filled 2017-12-31: qty 5

## 2017-12-31 MED ORDER — ONDANSETRON HCL 4 MG PO TABS: 4.0000 mg | ORAL_TABLET | Freq: Three times a day (TID) | ORAL | 0 refills | Status: DC | PRN

## 2017-12-31 MED ORDER — KETOROLAC TROMETHAMINE 30 MG/ML IJ SOLN
INTRAMUSCULAR | Status: DC | PRN
  Administered 2017-12-31: 30 mg via INTRAVENOUS

## 2017-12-31 MED ORDER — SODIUM CHLORIDE 0.9 % IR SOLN
Status: DC | PRN
  Administered 2017-12-31 (×3): 3000 mL

## 2017-12-31 MED ORDER — DEXAMETHASONE SODIUM PHOSPHATE 10 MG/ML IJ SOLN
INTRAMUSCULAR | Status: AC
  Filled 2017-12-31: qty 1

## 2017-12-31 MED ORDER — METHOCARBAMOL 500 MG PO TABS: 500.0000 mg | ORAL_TABLET | Freq: Three times a day (TID) | ORAL | 1 refills | Status: DC | PRN

## 2017-12-31 MED ORDER — OXYCODONE-ACETAMINOPHEN 5-325 MG PO TABS: 1.0000 | ORAL_TABLET | ORAL | 0 refills | Status: DC | PRN

## 2017-12-31 MED ORDER — STERILE WATER FOR IRRIGATION IR SOLN
Status: DC | PRN
  Administered 2017-12-31: 1000 mL

## 2017-12-31 MED ORDER — BUPIVACAINE-EPINEPHRINE 0.5% -1:200000 IJ SOLN
INTRAMUSCULAR | Status: DC | PRN
  Administered 2017-12-31: 20 mL

## 2017-12-31 MED ORDER — ONDANSETRON HCL 4 MG/2ML IJ SOLN
INTRAMUSCULAR | Status: AC
  Filled 2017-12-31: qty 2

## 2017-12-31 MED ORDER — DEXAMETHASONE SODIUM PHOSPHATE 10 MG/ML IJ SOLN
INTRAMUSCULAR | Status: DC | PRN
  Administered 2017-12-31: 10 mg via INTRAVENOUS

## 2017-12-31 MED ORDER — MIDAZOLAM HCL 2 MG/2ML IJ SOLN
INTRAMUSCULAR | Status: AC
  Filled 2017-12-31: qty 2

## 2017-12-31 MED ORDER — PROPOFOL 10 MG/ML IV BOLUS
INTRAVENOUS | Status: DC | PRN
  Administered 2017-12-31: 150 mg via INTRAVENOUS

## 2017-12-31 MED ORDER — GLYCOPYRROLATE 0.2 MG/ML IV SOSY
PREFILLED_SYRINGE | INTRAVENOUS | Status: DC | PRN
  Administered 2017-12-31: .2 mg via INTRAVENOUS

## 2017-12-31 MED ORDER — BUPIVACAINE-EPINEPHRINE (PF) 0.5% -1:200000 IJ SOLN
INTRAMUSCULAR | Status: AC
  Filled 2017-12-31: qty 30

## 2017-12-31 MED ORDER — MIDAZOLAM HCL 2 MG/2ML IJ SOLN: 0.5000 mg | Freq: Once | INTRAMUSCULAR | Status: DC | PRN

## 2017-12-31 SURGICAL SUPPLY — 42 items
BANDAGE ACE 6X5 VEL STRL LF (GAUZE/BANDAGES/DRESSINGS) ×1 IMPLANT
BLADE CUTTER GATOR 3.5 (BLADE) ×2 IMPLANT
BNDG CMPR MED 10X6 ELC LF (GAUZE/BANDAGES/DRESSINGS) ×1
BNDG COHESIVE 6X5 TAN STRL LF (GAUZE/BANDAGES/DRESSINGS) ×2 IMPLANT
BNDG ELASTIC 6X10 VLCR STRL LF (GAUZE/BANDAGES/DRESSINGS) ×2 IMPLANT
BNDG GAUZE ELAST 4 BULKY (GAUZE/BANDAGES/DRESSINGS) ×4 IMPLANT
CLSR STERI-STRIP ANTIMIC 1/2X4 (GAUZE/BANDAGES/DRESSINGS) ×1 IMPLANT
DRAPE ARTHROSCOPY W/POUCH 114 (DRAPES) ×2 IMPLANT
DRAPE ORTHO SPLIT 77X108 STRL (DRAPES) ×2
DRAPE SURG ORHT 6 SPLT 77X108 (DRAPES) IMPLANT
DURAPREP 26ML APPLICATOR (WOUND CARE) ×2 IMPLANT
ELECT MENISCUS 165MM 90D (ELECTRODE) IMPLANT
ELECT REM PT RETURN 9FT ADLT (ELECTROSURGICAL) ×2
ELECTRODE REM PT RTRN 9FT ADLT (ELECTROSURGICAL) ×1 IMPLANT
FLUID NSS /IRRIG 3000 ML XXX (IV SOLUTION) ×3 IMPLANT
GAUZE SPONGE 4X4 12PLY STRL (GAUZE/BANDAGES/DRESSINGS) IMPLANT
GAUZE SPONGE 4X4 12PLY STRL LF (GAUZE/BANDAGES/DRESSINGS) ×1 IMPLANT
GAUZE SPONGE 4X4 16PLY XRAY LF (GAUZE/BANDAGES/DRESSINGS) ×2 IMPLANT
GLOVE BIOGEL PI ORTHO PRO SZ8 (GLOVE) ×1
GLOVE PI ORTHO PRO STRL SZ8 (GLOVE) ×1 IMPLANT
GLOVE SURG ORTHO 8.5 STRL (GLOVE) ×2 IMPLANT
GOWN STRL REUS W/ TWL XL LVL3 (GOWN DISPOSABLE) ×2 IMPLANT
GOWN STRL REUS W/TWL XL LVL3 (GOWN DISPOSABLE) ×4
KIT BASIN OR (CUSTOM PROCEDURE TRAY) ×2 IMPLANT
KIT TURNOVER KIT B (KITS) ×2 IMPLANT
MANIFOLD NEPTUNE II (INSTRUMENTS) ×2 IMPLANT
NDL SPNL 18GX3.5 QUINCKE PK (NEEDLE) IMPLANT
NEEDLE SPNL 18GX3.5 QUINCKE PK (NEEDLE) ×2 IMPLANT
PACK ARTHROSCOPY DSU (CUSTOM PROCEDURE TRAY) ×2 IMPLANT
PAD ARMBOARD 7.5X6 YLW CONV (MISCELLANEOUS) ×4 IMPLANT
PENCIL BUTTON HOLSTER BLD 10FT (ELECTRODE) IMPLANT
PROBE BIPOLAR ATHRO 135MM 90D (MISCELLANEOUS) ×1 IMPLANT
SET ARTHROSCOPY TUBING (MISCELLANEOUS) ×2
SET ARTHROSCOPY TUBING LN (MISCELLANEOUS) ×1 IMPLANT
SPONGE LAP 18X18 X RAY DECT (DISPOSABLE) ×1 IMPLANT
STRIP CLOSURE SKIN 1/2X4 (GAUZE/BANDAGES/DRESSINGS) ×2 IMPLANT
SUT MNCRL AB 4-0 PS2 18 (SUTURE) ×2 IMPLANT
TOWEL OR 17X24 6PK STRL BLUE (TOWEL DISPOSABLE) ×4 IMPLANT
TUBE CONNECTING 12X1/4 (SUCTIONS) ×2 IMPLANT
WAND HAND CNTRL MULTIVAC 90 (MISCELLANEOUS) IMPLANT
WATER STERILE IRR 1000ML POUR (IV SOLUTION) ×2 IMPLANT
WRAP KNEE MAXI GEL POST OP (GAUZE/BANDAGES/DRESSINGS) ×2 IMPLANT

## 2017-12-31 NOTE — Transfer of Care (Signed)
Immediate Anesthesia Transfer of Care Note  Patient: Bruce [name]  Procedure(s) Performed: LEFT KNEE ARTHROSCOPY WITH MEDIAL MENISECTOMY (Left Knee)  Patient Location: PACU  Anesthesia Type:General  Level of Consciousness: awake, alert  and oriented  Airway & Oxygen Therapy: Patient Spontanous Breathing and Patient connected to face mask oxygen  Post-op Assessment: Report given to RN and Post -op Vital signs reviewed and stable  Post vital signs: Reviewed and stable  Last Vitals:  Vitals Value Taken Time  BP 151/87 12/31/2017  3:28 PM  Temp    Pulse 86 12/31/2017  3:29 PM  Resp 16 12/31/2017  3:29 PM  SpO2 100 % 12/31/2017  3:29 PM  Vitals shown include unvalidated device data.  Last Pain:  Vitals:   12/31/17 1219  TempSrc:   PainSc: 0-No pain         Complications: No apparent anesthesia complications

## 2017-12-31 NOTE — Interval H&P Note (Signed)
History and Physical Interval Note:  12/31/2017 1:59 PM  Bruce [name]  has presented today for surgery, with the diagnosis of Left knee medial meniscal tear  The various methods of treatment have been discussed with the patient and family. After consideration of risks, benefits and other options for treatment, the patient has consented to  Procedure(s): LEFT KNEE ARTHROSCOPY WITH MEDIAL MENISECTOMY (Left) as a surgical intervention .  The patient's history has been reviewed, patient examined, no change in status, stable for surgery.  I have reviewed the patient's chart and labs.  Questions were answered to the patient's satisfaction.     Erlene Devita,STEVEN R

## 2017-12-31 NOTE — Op Note (Signed)
NAME: Bruce [name], Bruce [name] MEDICAL RECORD ZO:[id number] ACCOUNT [id number] DATE OF BIRTH:1961/07/11 FACILITY: [facility] LOCATION: [facility] PHYSICIAN:STEVEN Russ Halo, MD  OPERATIVE REPORT  DATE OF PROCEDURE:  12/31/2017  PREOPERATIVE DIAGNOSIS:  Left knee medial meniscus tear.  POSTOPERATIVE DIAGNOSIS:   1.  Left knee medial meniscus tear. 2.  Medial compartment and patellofemoral compartment chondromalacia grade 3.  PROCEDURE PERFORMED:  Left knee arthroscopy with partial medial meniscectomy and chondroplasty, debridement technique, medial and patellofemoral compartments.  ATTENDING SURGEON:   Malon Kindle, MD  ASSISTANT:  None.  ANESTHESIA:  General anesthesia and local anesthesia was used.  ESTIMATED BLOOD LOSS:  Minimal.  FLUID REPLACEMENT:   1200 mL crystalloid.    COUNTS:  Instrument counts were correct.    COMPLICATIONS:  There were no complications.    Perioperative antibiotics were given.  INDICATIONS:  The patient is a 57 year old male with worsening left medial knee pain secondary to a torn meniscus.  The patient has failed an extended period of conservative management and desires operative treatment to restore function and eliminate  pain.  Informed consent obtained.  DESCRIPTION OF PROCEDURE:  After an adequate level of anesthesia, the patient was positioned in the supine position.  The left leg correctly identified and a lateral post was utilized.  The right leg was padded appropriately and secured to the operating  room table.  After sterile prep and drape of the left knee and leg, timeout was called. We verified correct patient, correct site.  We then initiated surgery using standard arthroscopic portals including superolateral outflow, anterolateral scope and  anteromedial working portals.  We identified significant synovitis throughout the knee joint.  There was some patellofemoral chondromalacia grade 3 with most of the wear on the medial trochlea, but some on  the back of the patella as well.  Loose flaps  and fibrillation noted.  A chondroplasty using debridement technique/tangential technique with the motorized shaver performed.  We were able to remove all unstable articular cartilage and smooth the areas between transitions from the damaged cartilage  and the normal cartilage.  Medial and lateral gutters inspected, multiple loose bodies identified.  Those were evacuated using suction shaver.  We entered the medial compartment.  There was a complete radial tear of the meniscus near the meniscal root.   We performed a partial meniscectomy with basket forceps, shallowing out the tear, removing the majority of the posterior horn and beveling that and tapering that into the middle horn.  The anterior horn was not involved.  There was also weightbearing  articular cartilage chondromalacia, loose flaps and fibrillation, debrided using chondroplasty technique, again debridement only.  We did not do any abrasion.  ACL and PCL intact.  Lateral compartment entered.  There was some free edge fraying of the  meniscus, but no tears.  We were able to probe and visualize it on both surfaces.  Meniscal root integrity intact, articular cartilage in good shape.  After chondroplasty in 2 compartments, completion of the partial meniscectomy and evacuation of loose  bodies, we concluded the surgery.  We sutured the wounds with 4-0 Monocryl followed by Steri-Strips and a sterile compressive bandage.  The patient was transported to recovery room in stable condition.  AN/NUANCE  D:12/31/2017 T:12/31/2017 JOB:[id number]

## 2017-12-31 NOTE — Anesthesia Procedure Notes (Signed)
Procedure Name: LMA Insertion Date/Time: 12/31/2017 2:24 PM Performed by: Pearson Grippe, CRNA Pre-anesthesia Checklist: Patient identified, Emergency Drugs available, Suction available and Patient being monitored Patient Re-evaluated:Patient Re-evaluated prior to induction Oxygen Delivery Method: Circle system utilized Preoxygenation: Pre-oxygenation with 100% oxygen Induction Type: IV induction Ventilation: Mask ventilation without difficulty LMA: LMA inserted LMA Size: 5.0 Number of attempts: 1 Airway Equipment and Method: Bite block Placement Confirmation: positive ETCO2 Tube secured with: Tape Dental Injury: Teeth and Oropharynx as per pre-operative assessment

## 2017-12-31 NOTE — Anesthesia Preprocedure Evaluation (Addendum)
Anesthesia Evaluation  Patient identified by MRN, date of birth, ID band Patient awake    Reviewed: Allergy & Precautions, NPO status , Patient's Chart, lab work & pertinent test results  History of Anesthesia Complications Negative for: history of anesthetic complications  Airway Mallampati: I  TM Distance: >3 FB Neck ROM: Full    Dental  (+) Dental Advisory Given, Chipped   Pulmonary Current Smoker,    breath sounds clear to auscultation       Cardiovascular hypertension, Pt. on medications (-) angina+ Valvular Problems/Murmurs (VSD)  Rhythm:Regular Rate:Normal  Echo 10/22/17 (care everywhere): - LV size is normal. Normal LV wall thickness. LV systolic function is normal. LV ejection fraction = 60-65%. LV wall motion is normal.  - There is restrictive supracristal VSD seen - LV filling pattern is prolonged relaxation. - RV is normal size. - RV systolic function is normal. - LA is mildly dilated. - No significant valvular stenosis or regurgitation     Neuro/Psych negative neurological ROS     GI/Hepatic negative GI ROS, Neg liver ROS,   Endo/Other  Morbid obesity  Renal/GU negative Renal ROS     Musculoskeletal  (+) Arthritis ,   Abdominal (+) + obese,   Peds  Hematology negative hematology ROS (+)   Anesthesia Other Findings   Reproductive/Obstetrics                            Anesthesia Physical Anesthesia Plan  ASA: III  Anesthesia Plan: General   Post-op Pain Management:    Induction: Intravenous  PONV Risk Score and Plan: 1 and Ondansetron and Dexamethasone  Airway Management Planned: LMA  Additional Equipment:   Intra-op Plan:   Post-operative Plan:   Informed Consent: I have reviewed the patients History and Physical, chart, labs and discussed the procedure including the risks, benefits and alternatives for the proposed anesthesia with the patient or authorized  representative who has indicated his/her understanding and acceptance.   Dental advisory given  Plan Discussed with: CRNA and Surgeon  Anesthesia Plan Comments: (Plan routine monitors, GA- LMA OK)        Anesthesia Quick Evaluation

## 2017-12-31 NOTE — Brief Op Note (Signed)
12/31/2017  3:35 PM  PATIENT:  Bruce [name]  57 y.o. male  PRE-OPERATIVE DIAGNOSIS:  Left knee medial meniscal tear, displaced  POST-OPERATIVE DIAGNOSIS:  Left knee medial meniscal tear, displaced and chondromalacia grade III med and pat/fem  PROCEDURE:  Procedure(s): LEFT KNEE ARTHROSCOPY WITH MEDIAL MENISECTOMY (Left)  SURGEON:  Surgeon(s) and Role:    Beverely Low, MD - Primary  PHYSICIAN ASSISTANT:   ASSISTANTS: none   ANESTHESIA:   local and general  EBL:  10 mL   BLOOD ADMINISTERED:none  DRAINS: none   LOCAL MEDICATIONS USED:  MARCAINE     SPECIMEN:  No Specimen  DISPOSITION OF SPECIMEN:  N/A  COUNTS:  YES  TOURNIQUET:  * No tourniquets in log *  DICTATION: .Other Dictation: Dictation Number [id number]  PLAN OF CARE: Discharge to home after PACU  PATIENT DISPOSITION:  PACU - hemodynamically stable.   Delay start of Pharmacological VTE agent (>24hrs) due to surgical blood loss or risk of bleeding: not applicable

## 2017-12-31 NOTE — Anesthesia Postprocedure Evaluation (Signed)
Anesthesia Post Note  Patient: Bruce [name]  Procedure(s) Performed: LEFT KNEE ARTHROSCOPY WITH MEDIAL MENISECTOMY (Left Knee)     Patient location during evaluation: PACU Anesthesia Type: General Level of consciousness: awake and alert, oriented and patient cooperative Pain management: pain level controlled Vital Signs Assessment: post-procedure vital signs reviewed and stable Respiratory status: spontaneous breathing, nonlabored ventilation and respiratory function stable Cardiovascular status: blood pressure returned to baseline and stable Postop Assessment: no apparent nausea or vomiting Anesthetic complications: no    Last Vitals:  Vitals:   12/31/17 1600 12/31/17 1611  BP: (!) 141/80   Pulse: 73   Resp: 14   Temp:  (!) 36.2 C  SpO2: 97%     Last Pain:  Vitals:   12/31/17 1611  TempSrc:   PainSc: 0-No pain                 Yasmin Bronaugh,E. Tyneka Scafidi

## 2017-12-31 NOTE — Discharge Instructions (Signed)
Ice the knee constantly and elevate the leg when you can.  Move frequently to change positions.  Prop under the calf or ankle NOTHING under the knee though, it will make your knee stiff.  Keep the knee covered and do not get it wet until next Monday when you can shower and get incision wet.  Remove the ACE wrap and gauze on Friday and leave the Steri Strips in place. Apply Band Aids over the Steri Strips and then pull the other white TED hose (stocking ) up over the knee.  Wear stockings 24/7 through your follow up with Dr Ranell Patrick in two weeks  Make sure that you are drinking plenty of water, eating fruit and taking stool softeners to prevent constipation  Call for appt in two weeks, call [phone]  Do the following exercises every hour.. Ankle Pumps Heel slides - knee bending Knee tightening (quad sets) - straight leg raises

## 2018-01-01 ENCOUNTER — Encounter (HOSPITAL_COMMUNITY): Payer: Self-pay | Admitting: Orthopedic Surgery

## 2018-04-10 ENCOUNTER — Encounter: Payer: Self-pay | Admitting: Family

## 2018-04-10 ENCOUNTER — Ambulatory Visit: Payer: BLUE CROSS/BLUE SHIELD | Admitting: Family

## 2018-04-10 VITALS — BP 133/73 | HR 70 | Temp 98.6°F | Ht 71.0 in | Wt 234.0 lb

## 2018-04-10 DIAGNOSIS — Z8249 Family history of ischemic heart disease and other diseases of the circulatory system: Secondary | ICD-10-CM

## 2018-04-10 DIAGNOSIS — E669 Obesity, unspecified: Secondary | ICD-10-CM | POA: Diagnosis not present

## 2018-04-10 DIAGNOSIS — I10 Essential (primary) hypertension: Secondary | ICD-10-CM

## 2018-04-10 DIAGNOSIS — Z1211 Encounter for screening for malignant neoplasm of colon: Secondary | ICD-10-CM

## 2018-04-10 DIAGNOSIS — Z0000 Encounter for general adult medical examination without abnormal findings: Secondary | ICD-10-CM | POA: Diagnosis not present

## 2018-04-10 DIAGNOSIS — F17200 Nicotine dependence, unspecified, uncomplicated: Secondary | ICD-10-CM

## 2018-04-10 DIAGNOSIS — J019 Acute sinusitis, unspecified: Secondary | ICD-10-CM

## 2018-04-10 DIAGNOSIS — E559 Vitamin D deficiency, unspecified: Secondary | ICD-10-CM

## 2018-04-10 MED ORDER — AMOXICILLIN-POT CLAVULANATE 875-125 MG PO TABS: 1.0000 | ORAL_TABLET | Freq: Two times a day (BID) | ORAL | 0 refills | Status: DC

## 2018-04-10 NOTE — Progress Notes (Signed)
Subjective:    Patient ID: Bruce [name], male    DOB: [birth year], 57 y.o.   MRN: [id number]  Chief Complaint  Patient presents with  . Hypertension    recheck  . Annual Exam   PT presents to the office today for CPE with complaints of sinus pressure and dizziness.  Hypertension  This is a chronic problem. The current episode started more than 1 year ago. The problem has been resolved since onset. The problem is controlled. Associated symptoms include headaches and malaise/fatigue. Pertinent negatives include no peripheral edema or shortness of breath. Risk factors for coronary artery disease include obesity, smoking/tobacco exposure and family history. The current treatment provides moderate improvement. There is no history of kidney disease, CAD/MI or heart failure.  Sinusitis  This is a new problem. The current episode started 1 to 4 weeks ago. The problem has been waxing and waning since onset. There has been no fever. His pain is at a severity of 5/10. The pain is moderate. Associated symptoms include congestion, headaches and sinus pressure. Pertinent negatives include no coughing, ear pain, shortness of breath or sore throat. Past treatments include nothing. The treatment provided no relief.      Review of Systems  Constitutional: Positive for malaise/fatigue.  HENT: Positive for congestion and sinus pressure. Negative for ear pain and sore throat.   Respiratory: Negative for cough and shortness of breath.   Neurological: Positive for headaches.  All other systems reviewed and are negative.      Objective:   Physical Exam  Constitutional: He is oriented to person, place, and time. He appears well-developed and well-nourished. No distress.  HENT:  Head: Normocephalic.  Right Ear: External ear normal.  Left Ear: External ear normal.  Nose: Rhinorrhea present. Right sinus exhibits frontal sinus tenderness. Left sinus exhibits frontal sinus tenderness.  Mouth/Throat:  Posterior oropharyngeal erythema present.  Eyes: Pupils are equal, round, and reactive to light. Right eye exhibits no discharge. Left eye exhibits no discharge.  Neck: Normal range of motion. Neck supple. No thyromegaly present.  Cardiovascular: Normal rate, regular rhythm and intact distal pulses.  Murmur heard. Pulmonary/Chest: Effort normal. No respiratory distress. He has no wheezes. He has rhonchi.  Abdominal: Soft. Bowel sounds are normal. He exhibits no distension. There is no tenderness.  Musculoskeletal: Normal range of motion. He exhibits no edema or tenderness.  Neurological: He is alert and oriented to person, place, and time. He has normal reflexes. No cranial nerve deficit.  Skin: Skin is warm and dry. No rash noted. No erythema.  Psychiatric: He has a normal mood and affect. His behavior is normal. Judgment and thought content normal.  Vitals reviewed.    BP 133/73   Pulse 70   Temp 98.6 F (37 C) (Oral)   Ht _0  (1.803 m)   Wt 234 lb (106.1 kg)   BMI 32.64 kg/m      Assessment & Plan:  Bruce [name] comes in today with chief complaint of Hypertension (recheck) and Annual Exam   Diagnosis and orders addressed:  1. Annual physical exam - Exercise Tolerance Test; Future - CMP14+EGFR - CBC with Differential/Platelet - Lipid panel - PSA, total and free - VITAMIN D 25 Hydroxy (Vit-D Deficiency, Fractures) - TSH - Ambulatory referral to Gastroenterology  2. Essential hypertension - Exercise Tolerance Test; Future - CMP14+EGFR - CBC with Differential/Platelet  3. Vitamin D deficiency - CMP14+EGFR - CBC with Differential/Platelet  4. Obesity (BMI 30-39.9) - Exercise Tolerance Test;  Future - CMP14+EGFR - CBC with Differential/Platelet  5. Current smoker - Exercise Tolerance Test; Future - CMP14+EGFR - CBC with Differential/Platelet  6. Acute sinusitis, recurrence not specified, unspecified location - Take meds as prescribed - Use a cool mist  humidifier  -Use saline nose sprays frequently -Force fluids -For any cough or congestion  Use plain Mucinex- regular strength or max strength is fine -For fever or aces or pains- take tylenol or ibuprofen. -Throat lozenges if help - amoxicillin-clavulanate (AUGMENTIN) 875-125 MG tablet; Take 1 tablet by mouth 2 (two) times daily.  Dispense: 14 tablet; Refill: 0 - CMP14+EGFR - CBC with Differential/Platelet  7. Family history of cardiovascular disease - Exercise Tolerance Test; Future - CMP14+EGFR - CBC with Differential/Platelet  8. Colon cancer screening - CMP14+EGFR - CBC with Differential/Platelet - Ambulatory referral to Gastroenterology   Labs pending Health Maintenance reviewed Diet and exercise encouraged  Follow up plan: 1 year   Evelina Dun, FNP

## 2018-04-10 NOTE — Patient Instructions (Signed)

## 2018-04-11 LAB — PSA, TOTAL AND FREE
PROSTATE SPECIFIC AG, SERUM: 0.5 ng/mL (ref 0.0–4.0)
PSA FREE: 0.14 ng/mL
PSA, Free Pct: 28 %

## 2018-04-11 LAB — VITAMIN D 25 HYDROXY (VIT D DEFICIENCY, FRACTURES): Vit D, 25-Hydroxy: 32.1 ng/mL (ref 30.0–100.0)

## 2018-04-11 LAB — CBC WITH DIFFERENTIAL/PLATELET
BASOS: 1 %
Basophils Absolute: 0.1 10*3/uL (ref 0.0–0.2)
EOS (ABSOLUTE): 0.2 10*3/uL (ref 0.0–0.4)
Eos: 3 %
Hematocrit: 45.3 % (ref 37.5–51.0)
Hemoglobin: 15.1 g/dL (ref 13.0–17.7)
IMMATURE GRANULOCYTES: 0 %
Immature Grans (Abs): 0 10*3/uL (ref 0.0–0.1)
Lymphocytes Absolute: 1.8 10*3/uL (ref 0.7–3.1)
Lymphs: 24 %
MCH: 28.9 pg (ref 26.6–33.0)
MCHC: 33.3 g/dL (ref 31.5–35.7)
MCV: 87 fL (ref 79–97)
MONOS ABS: 0.5 10*3/uL (ref 0.1–0.9)
Monocytes: 6 %
NEUTROS PCT: 66 %
Neutrophils Absolute: 4.9 10*3/uL (ref 1.4–7.0)
PLATELETS: 204 10*3/uL (ref 150–450)
RBC: 5.23 x10E6/uL (ref 4.14–5.80)
RDW: 13 % (ref 12.3–15.4)
WBC: 7.5 10*3/uL (ref 3.4–10.8)

## 2018-04-11 LAB — LIPID PANEL
Chol/HDL Ratio: 3.9 ratio (ref 0.0–5.0)
Cholesterol, Total: 150 mg/dL (ref 100–199)
HDL: 38 mg/dL — ABNORMAL LOW (ref >39)
LDL Calculated: 63 mg/dL (ref 0–99)
Triglycerides: 243 mg/dL — ABNORMAL HIGH (ref 0–149)
VLDL CHOLESTEROL CAL: 49 mg/dL — AB (ref 5–40)

## 2018-04-11 LAB — CMP14+EGFR
A/G RATIO: 2.3 — AB (ref 1.2–2.2)
ALK PHOS: 93 IU/L (ref 39–117)
ALT: 12 IU/L (ref 0–44)
AST: 10 IU/L (ref 0–40)
Albumin: 4.2 g/dL (ref 3.5–5.5)
BUN/Creatinine Ratio: 12 (ref 9–20)
BUN: 11 mg/dL (ref 6–24)
Bilirubin Total: 0.3 mg/dL (ref 0.0–1.2)
CALCIUM: 9.1 mg/dL (ref 8.7–10.2)
CO2: 27 mmol/L (ref 20–29)
Chloride: 101 mmol/L (ref 96–106)
Creatinine, Ser: 0.95 mg/dL (ref 0.76–1.27)
GFR calc Af Amer: 103 mL/min/{1.73_m2} (ref >59)
GFR, EST NON AFRICAN AMERICAN: 89 mL/min/{1.73_m2} (ref >59)
GLOBULIN, TOTAL: 1.8 g/dL (ref 1.5–4.5)
Glucose: 122 mg/dL — ABNORMAL HIGH (ref 65–99)
POTASSIUM: 3.4 mmol/L — AB (ref 3.5–5.2)
SODIUM: 143 mmol/L (ref 134–144)
Total Protein: 6 g/dL (ref 6.0–8.5)

## 2018-04-11 LAB — TSH: TSH: 1.68 u[IU]/mL (ref 0.450–4.500)

## 2018-04-13 ENCOUNTER — Encounter (INDEPENDENT_AMBULATORY_CARE_PROVIDER_SITE_OTHER): Payer: Self-pay | Admitting: *Deleted

## 2018-04-13 ENCOUNTER — Other Ambulatory Visit: Payer: Self-pay | Admitting: Family

## 2018-04-13 ENCOUNTER — Encounter: Payer: Self-pay | Admitting: *Deleted

## 2018-04-30 ENCOUNTER — Telehealth: Payer: Self-pay | Admitting: *Deleted

## 2018-04-30 NOTE — Telephone Encounter (Signed)
Yes patient looks like he would be a good candidate for treadmill

## 2018-04-30 NOTE — Telephone Encounter (Signed)
Please review for Treadmill

## 2018-05-05 NOTE — Telephone Encounter (Signed)
Appt scheduled for Thursday 9/5 and instructions reviewed.

## 2018-05-07 ENCOUNTER — Encounter: Payer: Self-pay | Admitting: Family

## 2018-05-07 ENCOUNTER — Ambulatory Visit (INDEPENDENT_AMBULATORY_CARE_PROVIDER_SITE_OTHER): Payer: BLUE CROSS/BLUE SHIELD

## 2018-05-07 DIAGNOSIS — I10 Essential (primary) hypertension: Secondary | ICD-10-CM

## 2018-05-07 DIAGNOSIS — E669 Obesity, unspecified: Secondary | ICD-10-CM

## 2018-05-07 DIAGNOSIS — Z8249 Family history of ischemic heart disease and other diseases of the circulatory system: Secondary | ICD-10-CM | POA: Diagnosis not present

## 2018-05-07 DIAGNOSIS — Z0000 Encounter for general adult medical examination without abnormal findings: Secondary | ICD-10-CM

## 2018-05-07 DIAGNOSIS — F17200 Nicotine dependence, unspecified, uncomplicated: Secondary | ICD-10-CM

## 2018-05-11 ENCOUNTER — Other Ambulatory Visit: Payer: Self-pay

## 2018-05-11 ENCOUNTER — Telehealth: Payer: Self-pay

## 2018-05-11 DIAGNOSIS — R9439 Abnormal result of other cardiovascular function study: Secondary | ICD-10-CM

## 2018-05-11 NOTE — Telephone Encounter (Signed)
Patient has stress test on Thursday 9/5. Dr. Louanne Skye was supposed to refer to cardiology.

## 2018-05-11 NOTE — Telephone Encounter (Signed)
I had placed an order for stress test in our office. Can we please make sure this is scheduled.

## 2018-05-11 NOTE — Telephone Encounter (Signed)
Thanks

## 2018-05-11 NOTE — Telephone Encounter (Signed)
Referral to cardiology placed.

## 2018-05-11 NOTE — Telephone Encounter (Signed)
Patient calling to see about his referral to cardioloy  ( No referral for cardiology)

## 2018-05-14 LAB — EXERCISE TOLERANCE TEST
CSEPED: 6 min
CSEPPHR: 143 {beats}/min
Estimated workload: 7.2 METS
Exercise duration (sec): 18 s
MPHR: 164 {beats}/min
Percent HR: 87 %
RPE: 7
Rest HR: 65 {beats}/min

## 2018-05-20 ENCOUNTER — Encounter: Payer: Self-pay | Admitting: Cardiovascular Disease

## 2018-05-20 ENCOUNTER — Ambulatory Visit: Payer: BLUE CROSS/BLUE SHIELD | Admitting: Cardiovascular Disease

## 2018-05-20 DIAGNOSIS — I10 Essential (primary) hypertension: Secondary | ICD-10-CM

## 2018-05-20 DIAGNOSIS — R9431 Abnormal electrocardiogram [ECG] [EKG]: Secondary | ICD-10-CM | POA: Diagnosis not present

## 2018-05-20 DIAGNOSIS — Q210 Ventricular septal defect: Secondary | ICD-10-CM

## 2018-05-20 DIAGNOSIS — F17200 Nicotine dependence, unspecified, uncomplicated: Secondary | ICD-10-CM | POA: Diagnosis not present

## 2018-05-20 DIAGNOSIS — Q2542 Hypoplasia of aorta: Secondary | ICD-10-CM

## 2018-05-20 NOTE — Assessment & Plan Note (Signed)
History of essential hypertension her blood pressure measured today at 116/72.  He is on lisinopril and hydrochlorothiazide.

## 2018-05-20 NOTE — Assessment & Plan Note (Signed)
Of congenital VSD since childhood [place]  He is currently asymptomatic from this but has a classic VSD murmur on exam.

## 2018-05-20 NOTE — Assessment & Plan Note (Signed)
80 pack years of tobacco abuse currently smoking 2 packs/day recalcitrant risk factor modification.

## 2018-05-20 NOTE — Assessment & Plan Note (Signed)
Mr Pricilla Holmucker was referred by Jannifer Rodneyhristy Hawks FNP for an abnormal routine GXT.  He had 0.5 mm of ST segment depression that was asymptomatic.  Technically this was a negative GXT and requires no further evaluation given his lack of symptoms.

## 2018-05-20 NOTE — Progress Notes (Signed)
05/20/2018 Bruce [name]   11/17/1960  [id number]  Primary Physician Junie SpencerHawks, Christy A, FNP Primary Cardiologist: Runell GessJonathan J Almedia Cordell MD Nicholes CalamityFACP, FACC, FAHA, [place]  HPI:  Bruce DickRichard K Farra is a 57 y.o. moderately overweight married Caucasian male father of one living child, grandfather one grandchild who works in [employer].  He was referred by Jannifer Rodneyhristy Hawks FNP because of an abnormal GXT.  His risk factors include 80 pack years of tobacco abuse as well as treated hypertension.  There is no family history of heart disease.  He is never had a heart attack or stroke and denies chest pain or shortness of breath.  He did have a recent GXT that showed 0.5 mm of ST segment depression without symptoms.  He has a history of congenital VSD but is asymptomatic from this [place]   Current Meds  Medication Sig  . lisinopril-hydrochlorothiazide (ZESTORETIC) 20-12.5 MG tablet Take 1 tablet by mouth daily.  . [DISCONTINUED] amoxicillin-clavulanate (AUGMENTIN) 875-125 MG tablet Take 1 tablet by mouth 2 (two) times daily.     No Known Allergies  Social History   Socioeconomic History  . Marital status: Married    Spouse name: Not on file  . Number of children: Not on file  . Years of education: Not on file  . Highest education level: Not on file  Occupational History  . Not on file  Social Needs  . Financial resource strain: Not on file  . Food insecurity:    Worry: Not on file    Inability: Not on file  . Transportation needs:    Medical: Not on file    Non-medical: Not on file  Tobacco Use  . Smoking status: Current Every Day Smoker    Packs/day: 2.00    Types: Cigarettes  . Smokeless tobacco: Never Used  Substance and Sexual Activity  . Alcohol use: No  . Drug use: No  . Sexual activity: Yes  Lifestyle  . Physical activity:    Days per week: Not on file    Minutes per session: Not on file  . Stress: Not on file  Relationships  . Social  connections:    Talks on phone: Not on file    Gets together: Not on file    Attends religious service: Not on file    Active member of club or organization: Not on file    Attends meetings of clubs or organizations: Not on file    Relationship status: Not on file  . Intimate partner violence:    Fear of current or ex partner: Not on file    Emotionally abused: Not on file    Physically abused: Not on file    Forced sexual activity: Not on file  Other Topics Concern  . Not on file  Social History Narrative  . Not on file     Review of Systems: General: negative for chills, fever, night sweats or weight changes.  Cardiovascular: negative for chest pain, dyspnea on exertion, edema, orthopnea, palpitations, paroxysmal nocturnal dyspnea or shortness of breath Dermatological: negative for rash Respiratory: negative for cough or wheezing Urologic: negative for hematuria Abdominal: negative for nausea, vomiting, diarrhea, bright red blood per rectum, melena, or hematemesis Neurologic: negative for visual changes, syncope, or dizziness All other systems reviewed and are otherwise negative except as noted above.    Blood pressure 116/72, pulse 65, height 5\' 11"  (1.803 m), weight 236 lb 6.4 oz (107.2 kg).  General appearance:  alert and no distress Neck: no adenopathy, no JVD, supple, symmetrical, trachea midline, thyroid not enlarged, symmetric, no tenderness/mass/nodules and Bilateral carotid bruits versus transmitted murmur. Lungs: clear to auscultation bilaterally Heart: 2/6 to 3/6 pansystolic washer machine type murmur consistent with VSD. Extremities: extremities normal, atraumatic, no cyanosis or edema Pulses: 2+ and symmetric Skin: Skin color, texture, turgor normal. No rashes or lesions Neurologic: Alert and oriented X 3, normal strength and tone. Normal symmetric reflexes. Normal coordination and gait  EKG not performed today.  ASSESSMENT AND PLAN:   Abnormal ECG during  exercise stress test  Mr Speir was referred by [name] FNP for an abnormal routine GXT.  He had 0.5 mm of ST segment depression that was asymptomatic.  Technically this was a negative GXT and requires no further evaluation given his lack of symptoms.  Current smoker 80 pack years of tobacco abuse currently smoking 2 packs/day recalcitrant risk factor modification.  Hypertension History of essential hypertension her blood pressure measured today at 116/72.  He is on lisinopril and hydrochlorothiazide.  VSD (ventricular septal defect and aortic arch hypoplasia Of congenital VSD since childhood with echo performed by cardiologist in [place].  He is currently asymptomatic from this but has a classic VSD murmur on exam.      Runell Gess MD [facility], [facility] 05/20/2018 3:44 PM

## 2018-05-20 NOTE — Patient Instructions (Signed)
Medication Instructions:  Your physician recommends that you continue on your current medications as directed. Please refer to the Current Medication list given to you today.   Labwork: none  Testing/Procedures: none  Follow-Up: Follow up with Dr. Berry as needed.   Any Other Special Instructions Will Be Listed Below (If Applicable).     If you need a refill on your cardiac medications before your next appointment, please call your pharmacy.   

## 2018-05-27 ENCOUNTER — Encounter: Payer: Self-pay | Admitting: Family Medicine

## 2018-05-27 ENCOUNTER — Ambulatory Visit (INDEPENDENT_AMBULATORY_CARE_PROVIDER_SITE_OTHER): Payer: BLUE CROSS/BLUE SHIELD | Admitting: Family Medicine

## 2018-05-27 VITALS — BP 137/74 | HR 66 | Temp 99.3°F | Ht 71.0 in | Wt 236.0 lb

## 2018-05-27 DIAGNOSIS — Z024 Encounter for examination for driving license: Secondary | ICD-10-CM

## 2018-05-27 NOTE — Progress Notes (Signed)
Subjective:  Patient ID: Bruce [name], male    DOB: 1960-11-13  Age: 57 y.o. MRN: [id number]  CC: Private DOT Physical   HPI Bruce [name] presents for Department of Transportation evaluation.  Depression screen [facility] 2/9 04/10/2018 12/18/2017 12/04/2017  Decreased Interest 0 0 0  Down, Depressed, Hopeless 0 0 0  PHQ - 2 Score 0 0 0    History Bruce [name] has a past medical history of Clotting disorder (HCC), Heart murmur, Hypertension, Meningitis, and VSD (ventricular septal defect), supracristal.   Bruce [name] has a past surgical history that includes Knee arthroscopy with medial menisectomy (Left, 12/31/2017).   His family history includes Arthritis in his mother; Cancer in his brother, father, and mother; Diabetes in his mother; Hyperlipidemia in his mother; Hypertension in his mother.Bruce [name] reports that Bruce [name] has been smoking cigarettes. Bruce [name] has been smoking about 2.00 packs per day. Bruce [name] has never used smokeless tobacco. Bruce [name] reports that Bruce [name] does not drink alcohol or use drugs.    ROS Review of Systems  Constitutional: Negative.   HENT: Negative.   Eyes: Negative for visual disturbance.  Respiratory: Negative for cough and shortness of breath.   Cardiovascular: Negative for chest pain and leg swelling.  Gastrointestinal: Negative for abdominal pain, diarrhea, nausea and vomiting.  Genitourinary: Negative for difficulty urinating.  Musculoskeletal: Negative for arthralgias and myalgias.  Skin: Negative for rash.  Neurological: Negative for headaches.  Psychiatric/Behavioral: Negative for sleep disturbance.    Objective:  BP 137/74   Pulse 66   Temp 99.3 F (37.4 C) (Oral)   Ht 5\' 11"  (1.803 m)   Wt 236 lb (107 kg)   BMI 32.92 kg/m   BP Readings from Last 3 Encounters:  05/27/18 137/74  05/20/18 116/72  04/10/18 133/73    Wt Readings from Last 3 Encounters:  05/27/18 236 lb (107 kg)  05/20/18 236 lb 6.4 oz (107.2 kg)  04/10/18 234 lb (106.1 kg)     Physical Exam    Constitutional: Bruce [name] is oriented to person, place, and time. Bruce [name] appears well-developed and well-nourished. No distress.  HENT:  Head: Normocephalic and atraumatic.  Right Ear: External ear normal.  Left Ear: External ear normal.  Nose: Nose normal.  Mouth/Throat: Oropharynx is clear and moist.  Eyes: Pupils are equal, round, and reactive to light. Conjunctivae and EOM are normal.  Neck: Normal range of motion. Neck supple.  Cardiovascular: Normal rate, regular rhythm and normal heart sounds.  No murmur heard. Pulmonary/Chest: Effort normal and breath sounds normal. No respiratory distress. Bruce [name] has no wheezes. Bruce [name] has no rales.  Abdominal: Soft. There is no tenderness.  Musculoskeletal: Normal range of motion.  Neurological: Bruce [name] is alert and oriented to person, place, and time. Bruce [name] has normal reflexes.  Skin: Skin is warm and dry.  Psychiatric: Bruce [name] has a normal mood and affect. His behavior is normal. Judgment and thought content normal.      Assessment & Plan:   Bruce [name] was seen today for private dot physical.  Diagnoses and all orders for this visit:  Encounter for Department of Transportation (DOT) examination for driving license renewal       I am having Bruce [name] maintain his lisinopril-hydrochlorothiazide.  Allergies as of 05/27/2018   No Known Allergies     Medication List        Accurate as of 05/27/18  6:10 PM. Always use your most recent med list.          lisinopril-hydrochlorothiazide 20-12.5 MG tablet Commonly  known as:  PRINZIDE,ZESTORETIC Take 1 tablet by mouth daily.        Follow-up: Return in about 1 year (around 05/28/2019).  Mechele Claude, M.D.

## 2018-11-30 ENCOUNTER — Ambulatory Visit (INDEPENDENT_AMBULATORY_CARE_PROVIDER_SITE_OTHER): Payer: BLUE CROSS/BLUE SHIELD | Admitting: Family

## 2018-11-30 ENCOUNTER — Encounter: Payer: Self-pay | Admitting: Family

## 2018-11-30 ENCOUNTER — Other Ambulatory Visit: Payer: Self-pay

## 2018-11-30 DIAGNOSIS — K649 Unspecified hemorrhoids: Secondary | ICD-10-CM

## 2018-11-30 DIAGNOSIS — I10 Essential (primary) hypertension: Secondary | ICD-10-CM | POA: Diagnosis not present

## 2018-11-30 DIAGNOSIS — F17200 Nicotine dependence, unspecified, uncomplicated: Secondary | ICD-10-CM | POA: Diagnosis not present

## 2018-11-30 MED ORDER — LISINOPRIL 20 MG PO TABS: 20.0000 mg | ORAL_TABLET | Freq: Every day | ORAL | 3 refills | Status: DC

## 2018-11-30 MED ORDER — HYDROCORTISONE ACETATE 25 MG RE SUPP: 25.0000 mg | Freq: Two times a day (BID) | RECTAL | 0 refills | Status: DC

## 2018-11-30 NOTE — Progress Notes (Signed)
Virtual Visit via telephone Note  I connected with Bruce [name] on 11/30/18 at 11:21 AM by FaceTime and verified that I am speaking with the correct person using two identifiers. Bruce [name] is currently located in his parked car and no one  is currently with her during visit. The provider, [name], FNP is located in their office at time of visit.  I discussed the limitations, risks, security and privacy concerns of performing an evaluation and management service by telephone and the availability of in person appointments. I also discussed with the patient that there may be a patient responsible charge related to this service. The patient expressed understanding and agreed to proceed.   History and Present Illness:   PT presents to the office today with complaints of medication problem. States he started having nosebleeds in January for three weeks. States nothing had changed and thought this was related to his linsioprin/HCT 20-12.5 mg. He states he stopped taking the medication and his nose bleed stopped. He states he restarted his medication a few weeks later and his nose bleed and headache returned. He states he did not take his BP medication the entire month of February.   He states he tried to restarted a few weeks ago and started having nosebleeds and headache again. He has not taken his BP at home.   He is also complaining of hemorrhoids that started over a week ago. Complaining of intermittent aching/burning pain of 4 out 10.   Hypertension  This is a chronic problem. The current episode started more than 1 year ago. The problem has been waxing and waning since onset. Associated symptoms include headaches. Pertinent negatives include no malaise/fatigue, peripheral edema or shortness of breath. Risk factors for coronary artery disease include dyslipidemia. Past treatments include nothing.      Review of Systems  Constitutional: Negative for malaise/fatigue.   Respiratory: Negative for shortness of breath.   Neurological: Positive for headaches.  All other systems reviewed and are negative.      Observations/Objective: No SOB or distress noted.  Assessment and Plan: 1. Essential hypertension Will stop HCTZ  -Daily blood pressure log given with instructions on how to fill out and told to bring to next visit -Dash diet information given -Exercise encouraged - Stress Management  -Continue current meds -RTO in 2 weeks to recheck BP and we will do lab work - lisinopril (PRINIVIL,ZESTRIL) 20 MG tablet; Take 1 tablet (20 mg total) by mouth daily.  Dispense: 90 tablet; Refill: 3  2. Hemorrhoids, unspecified hemorrhoid type Avoid straining - hydrocortisone (ANUSOL-HC) 25 MG suppository; Place 1 suppository (25 mg total) rectally 2 (two) times daily.  Dispense: 12 suppository; Refill: 0  3. Current smoker Smoking cessation discussed     I discussed the assessment and treatment plan with the patient. The patient was provided an opportunity to ask questions and all were answered. The patient agreed with the plan and demonstrated an understanding of the instructions.   The patient was advised to call back or seek an in-person evaluation if the symptoms worsen or if the condition fails to improve as anticipated.  The above assessment and management plan was discussed with the patient. The patient verbalized understanding of and has agreed to the management plan. Patient is aware to call the clinic if symptoms persist or worsen. Patient is aware when to return to the clinic for a follow-up visit. Patient educated on when it is appropriate to go to the emergency department.  FaceTime call ended 11: 37 AM, I provided 16 minutes of non-face-to-face time during this encounter.    [name], FNP

## 2018-12-02 ENCOUNTER — Emergency Department (HOSPITAL_COMMUNITY)
Admission: EM | Admit: 2018-12-02 | Discharge: 2018-12-03 | Disposition: A | Discharge status: Home / Self Care | Payer: BLUE CROSS/BLUE SHIELD | Attending: Emergency Medicine | Admitting: Emergency Medicine

## 2018-12-02 ENCOUNTER — Emergency Department (HOSPITAL_COMMUNITY): Payer: BLUE CROSS/BLUE SHIELD

## 2018-12-02 ENCOUNTER — Encounter (HOSPITAL_COMMUNITY): Payer: Self-pay | Admitting: Emergency Medicine

## 2018-12-02 ENCOUNTER — Other Ambulatory Visit: Payer: Self-pay

## 2018-12-02 DIAGNOSIS — K5732 Diverticulitis of large intestine without perforation or abscess without bleeding: Secondary | ICD-10-CM | POA: Diagnosis not present

## 2018-12-02 DIAGNOSIS — K645 Perianal venous thrombosis: Secondary | ICD-10-CM

## 2018-12-02 DIAGNOSIS — F17210 Nicotine dependence, cigarettes, uncomplicated: Secondary | ICD-10-CM | POA: Diagnosis not present

## 2018-12-02 DIAGNOSIS — R1030 Lower abdominal pain, unspecified: Secondary | ICD-10-CM | POA: Diagnosis not present

## 2018-12-02 DIAGNOSIS — K625 Hemorrhage of anus and rectum: Secondary | ICD-10-CM | POA: Diagnosis not present

## 2018-12-02 DIAGNOSIS — I10 Essential (primary) hypertension: Secondary | ICD-10-CM | POA: Diagnosis not present

## 2018-12-02 LAB — CBC WITH DIFFERENTIAL/PLATELET
Abs Immature Granulocytes: 0.01 10*3/uL (ref 0.00–0.07)
Basophils Absolute: 0.1 10*3/uL (ref 0.0–0.1)
Basophils Relative: 1 %
Eosinophils Absolute: 0.1 10*3/uL (ref 0.0–0.5)
Eosinophils Relative: 2 %
HCT: 46.1 % (ref 39.0–52.0)
Hemoglobin: 14.9 g/dL (ref 13.0–17.0)
Immature Granulocytes: 0 %
Lymphocytes Relative: 38 %
Lymphs Abs: 3 10*3/uL (ref 0.7–4.0)
MCH: 28.4 pg (ref 26.0–34.0)
MCHC: 32.3 g/dL (ref 30.0–36.0)
MCV: 88 fL (ref 80.0–100.0)
Monocytes Absolute: 0.7 10*3/uL (ref 0.1–1.0)
Monocytes Relative: 9 %
Neutro Abs: 3.9 10*3/uL (ref 1.7–7.7)
Neutrophils Relative %: 50 %
Platelets: 179 10*3/uL (ref 150–400)
RBC: 5.24 MIL/uL (ref 4.22–5.81)
RDW: 14.2 % (ref 11.5–15.5)
WBC: 7.8 10*3/uL (ref 4.0–10.5)
nRBC: 0 % (ref 0.0–0.2)

## 2018-12-02 MED ORDER — MORPHINE SULFATE (PF) 4 MG/ML IV SOLN
4.0000 mg | Freq: Once | INTRAVENOUS | Status: AC
  Administered 2018-12-02: 4 mg via INTRAVENOUS
  Filled 2018-12-02: qty 1

## 2018-12-02 MED ORDER — ONDANSETRON HCL 4 MG/2ML IJ SOLN
4.0000 mg | Freq: Once | INTRAMUSCULAR | Status: AC
  Administered 2018-12-02: 4 mg via INTRAVENOUS
  Filled 2018-12-02: qty 2

## 2018-12-02 MED ORDER — LIDOCAINE HCL (PF) 2 % IJ SOLN
INTRAMUSCULAR | Status: AC
  Administered 2018-12-03: 10 mL
  Filled 2018-12-02: qty 20

## 2018-12-02 MED ORDER — LIDOCAINE HCL (PF) 2 % IJ SOLN
10.0000 mL | Freq: Once | INTRAMUSCULAR | Status: AC
  Administered 2018-12-03: 10 mL

## 2018-12-02 NOTE — ED Triage Notes (Signed)
Patient complaining of rectal bleeding x 30 minutes. States he has history of hemorrhoids.

## 2018-12-03 DIAGNOSIS — K5732 Diverticulitis of large intestine without perforation or abscess without bleeding: Secondary | ICD-10-CM | POA: Diagnosis not present

## 2018-12-03 LAB — COMPREHENSIVE METABOLIC PANEL
ALT: 14 U/L (ref 0–44)
AST: 17 U/L (ref 15–41)
Albumin: 4 g/dL (ref 3.5–5.0)
Alkaline Phosphatase: 92 U/L (ref 38–126)
Anion gap: 7 (ref 5–15)
BUN: 11 mg/dL (ref 6–20)
CO2: 26 mmol/L (ref 22–32)
Calcium: 8.7 mg/dL — ABNORMAL LOW (ref 8.9–10.3)
Chloride: 106 mmol/L (ref 98–111)
Creatinine, Ser: 0.86 mg/dL (ref 0.61–1.24)
GFR calc Af Amer: >60 mL/min (ref >60)
GFR calc non Af Amer: >60 mL/min (ref >60)
Glucose, Bld: 106 mg/dL — ABNORMAL HIGH (ref 70–99)
Potassium: 3.9 mmol/L (ref 3.5–5.1)
Sodium: 139 mmol/L (ref 135–145)
Total Bilirubin: 0.3 mg/dL (ref 0.3–1.2)
Total Protein: 6.7 g/dL (ref 6.5–8.1)

## 2018-12-03 MED ORDER — IOHEXOL 300 MG/ML  SOLN
100.0000 mL | Freq: Once | INTRAMUSCULAR | Status: AC | PRN
  Administered 2018-12-03: 100 mL via INTRAVENOUS

## 2018-12-03 NOTE — Discharge Instructions (Addendum)
Continue using your hemorrhoid medicine that was prescribed by your primary doctor.  A warm sitz bath as instructed below can also help this hemorrhoid continue to heal.  Your Ct scan is negative tonight for the source of your abdominal pain. It does show an incidental mass on your adrenal gland. Let your doctor know about this and you should have a repeat CT scan in 1 year to make sure it is not changing in size. Plan to see your doctor for a recheck if your symptoms persist.  Return here for any worsened pain, fevers, vomiting or any new concerns.

## 2018-12-03 NOTE — ED Notes (Signed)
Advised patient not to drive after discharge due to narcotic medication administration. Patient verbalized understanding. Discharged to parking lot for wife to drive him home.

## 2018-12-03 NOTE — ED Provider Notes (Addendum)
[facility] EMERGENCY DEPARTMENT Provider Note   CSN: [id number] Arrival date & time: 12/02/18  2020    History   Chief Complaint Chief Complaint  Patient presents with  . Rectal Bleeding    HPI Bruce [name] is a 58 y.o. male with a history of HTN and prior history of external hemorrhoids presenting with low abdominal pain and rectal bleeding.  He was sleeping this evening when he woke due to low pelvic cramping pain along with abdominal distention and felt the need to have a bowel movement.  He had a small soft bowel movement which was brown in color but surrounded by bright red blood and after cleaning himself, had further bright red blood dripping down his leg before he was able to get it stopped.  He continues to have low abdominal cramping pain and feels very gassy and bloated.  He has had a history of hemorrhoids in the past, in fact had a tele-visit with his PCP 2 days ago during which time she prescribed him hydrocortisone cream for suspected hemorrhoid flare.  He denies fevers or chills, no nausea or vomiting.  Ate a good meal for dinner this evening without triggering abdominal pain till he woke hours later.  He has had no medications or treatment for his pain prior to arrival.  No abdominal surgical history.     The history is provided by the patient.    Past Medical History:  Diagnosis Date  . Clotting disorder (HCC)    Difficulty clotting blood after tooth extractions or lacerations.  . Heart murmur   . Hypertension   . Meningitis    Had at age 66 years old.  . VSD (ventricular septal defect), supracristal     Patient Active Problem List   Diagnosis Date Noted  . Abnormal ECG during exercise stress test 05/20/2018  . VSD (ventricular septal defect and aortic arch hypoplasia 05/20/2018  . Hypertension 12/18/2017  . Vitamin D deficiency 03/24/2017  . Current smoker 03/21/2017  . Obesity (BMI 30-39.9) 03/21/2017    Past Surgical History:  Procedure Laterality  Date  . KNEE ARTHROSCOPY WITH MEDIAL MENISECTOMY Left 12/31/2017   Procedure: LEFT KNEE ARTHROSCOPY WITH MEDIAL MENISECTOMY;  Surgeon: Beverely Low, MD;  Location: [facility] OR;  Service: Orthopedics;  Laterality: Left;        Home Medications    Prior to Admission medications   Medication Sig Start Date End Date Taking? Authorizing Provider  hydrocortisone (ANUSOL-HC) 25 MG suppository Place 1 suppository (25 mg total) rectally 2 (two) times daily. 11/30/18  Yes Hawks, Christy A, FNP  lisinopril (PRINIVIL,ZESTRIL) 20 MG tablet Take 1 tablet (20 mg total) by mouth daily. 11/30/18  Yes Hawks, Edilia Bo, FNP    Family History Family History  Problem Relation Age of Onset  . Arthritis Mother   . Hypertension Mother   . Hyperlipidemia Mother   . Cancer Mother        Brain tumor.  . Diabetes Mother   . Cancer Father   . Cancer Brother        Bone    Social History Social History   Tobacco Use  . Smoking status: Current Every Day Smoker    Packs/day: 2.00    Types: Cigarettes  . Smokeless tobacco: Never Used  Substance Use Topics  . Alcohol use: No  . Drug use: No     Allergies   Patient has no known allergies.   Review of Systems Review of Systems  Constitutional: Negative  for chills and fever.  HENT: Negative for congestion and sore throat.   Eyes: Negative.   Respiratory: Negative for chest tightness and shortness of breath.   Cardiovascular: Negative for chest pain.  Gastrointestinal: Positive for abdominal pain, anal bleeding and rectal pain. Negative for nausea and vomiting.  Genitourinary: Negative.   Musculoskeletal: Negative for arthralgias, joint swelling and neck pain.  Skin: Negative.  Negative for rash and wound.  Neurological: Negative for dizziness, weakness, light-headedness, numbness and headaches.  Psychiatric/Behavioral: Negative.      Physical Exam Updated Vital Signs BP (!) 157/104   Pulse 71   Temp 97.9 F (36.6 C) (Oral)   Resp 18   Ht 5'  11" (1.803 m)   Wt 103.4 kg   SpO2 94%   BMI 31.80 kg/m   Physical Exam Vitals signs and nursing note reviewed. Exam conducted with a chaperone present.  Constitutional:      Appearance: He is well-developed.  HENT:     Head: Normocephalic and atraumatic.  Eyes:     Conjunctiva/sclera: Conjunctivae normal.  Neck:     Musculoskeletal: Normal range of motion.  Cardiovascular:     Rate and Rhythm: Normal rate and regular rhythm.     Heart sounds: Normal heart sounds.  Pulmonary:     Effort: Pulmonary effort is normal.     Breath sounds: Normal breath sounds. No wheezing.  Abdominal:     General: Bowel sounds are normal. There is distension.     Palpations: Abdomen is soft. There is no mass.     Tenderness: There is abdominal tenderness in the suprapubic area. There is guarding. There is no rebound.     Hernia: No hernia is present.     Comments: Tenderness palpation with mild guarding in the suprapubic region.  Generalized distention and increased tympany to percussion throughout all quadrants.  Genitourinary:    Rectum: External hemorrhoid present.     Comments: There is a 1 cm sized raised, flat topped indurated perirectal lesion at the 9 o'clock position with a central blood clot.  Tender to palpation.  No surrounding erythema. Musculoskeletal: Normal range of motion.  Skin:    General: Skin is warm and dry.  Neurological:     Mental Status: He is alert.      ED Treatments / Results  Labs (all labs ordered are listed, but only abnormal results are displayed) Labs Reviewed  COMPREHENSIVE METABOLIC PANEL - Abnormal; Notable for the following components:      Result Value   Glucose, Bld 106 (*)    Calcium 8.7 (*)    All other components within normal limits  CBC WITH DIFFERENTIAL/PLATELET  URINALYSIS, ROUTINE W REFLEX MICROSCOPIC    EKG None  Radiology No results found.  Procedures  Hemorrhoid thrombectomy Thrombectomy Date/Time: 12/03/2018 12:00 AM Performed  by: Burgess Amor, PA-C Authorized by: Burgess Amor, PA-C  Consent: Verbal consent obtained. Risks and benefits: risks, benefits and alternatives were discussed Consent given by: patient Patient understanding: patient states understanding of the procedure being performed Patient identity confirmed: verbally with patient Time out: Immediately prior to procedure a "time out" was called to verify the correct patient, procedure, equipment, support staff and site/side marked as required. Preparation: Patient was prepped and draped in the usual sterile fashion. Local anesthesia used: yes  Anesthesia: Local anesthesia used: yes Local Anesthetic: lidocaine 2% without epinephrine Anesthetic total: 6 mL  Sedation: Patient sedated: no  Patient tolerance: Patient tolerated the procedure well with no immediate  complications Comments: Pt with an already erupted thrombosed hemorrhoid.  Procedure including complete flushing of the hemorrhoid using NS and syringe which adequately removed the remaining blood clot.  Pt tolerated well.    (including critical care time)    Medications Ordered in ED Medications  lidocaine (XYLOCAINE) 2 % injection 10 mL (10 mLs Other Given by Other 12/03/18 0004)  morphine 4 MG/ML injection 4 mg (4 mg Intravenous Given 12/02/18 2347)  ondansetron (ZOFRAN) injection 4 mg (4 mg Intravenous Given 12/02/18 2347)  iohexol (OMNIPAQUE) 300 MG/ML solution 100 mL (100 mLs Intravenous Contrast Given 12/03/18 0051)     Initial Impression / Assessment and Plan / ED Course  I have reviewed the triage vital signs and the nursing notes.  Pertinent labs & imaging results that were available during my care of the patient were reviewed by me and considered in my medical decision making (see chart for details).        Pt with abdominal pain and bloating along with a ruptured thrombosed external hemorrhoid.  Clot was fully evacuated and flushed.  Pt tolerated well.  Ct imaging pending to  assess for source of abd/pelvic pain and distention.    Care assumed by Dr Manus Gunning pending CT exam/result.  Final Clinical Impressions(s) / ED Diagnoses   Final diagnoses:  External hemorrhoid, thrombosed  Lower abdominal pain    ED Discharge Orders    None       Victoriano Lain 12/03/18 0100    Glynn Octave, MD 12/03/18 0129    Burgess Amor, PA-C 12/21/18 1221    Rancour, Jeannett Senior, MD 12/22/18 0740    Glynn Octave, MD 12/22/18 [phone]

## 2018-12-15 ENCOUNTER — Other Ambulatory Visit: Payer: Self-pay

## 2018-12-16 ENCOUNTER — Ambulatory Visit: Payer: BLUE CROSS/BLUE SHIELD | Admitting: Family

## 2018-12-17 ENCOUNTER — Other Ambulatory Visit: Payer: Self-pay

## 2018-12-17 ENCOUNTER — Ambulatory Visit: Payer: BLUE CROSS/BLUE SHIELD | Admitting: Family

## 2018-12-17 ENCOUNTER — Encounter: Payer: Self-pay | Admitting: Family

## 2018-12-17 VITALS — BP 144/84 | HR 61 | Temp 97.2°F | Ht 71.0 in | Wt 227.0 lb

## 2018-12-17 DIAGNOSIS — F17200 Nicotine dependence, unspecified, uncomplicated: Secondary | ICD-10-CM | POA: Diagnosis not present

## 2018-12-17 DIAGNOSIS — I10 Essential (primary) hypertension: Secondary | ICD-10-CM | POA: Diagnosis not present

## 2018-12-17 DIAGNOSIS — E278 Other specified disorders of adrenal gland: Secondary | ICD-10-CM

## 2018-12-17 DIAGNOSIS — E669 Obesity, unspecified: Secondary | ICD-10-CM

## 2018-12-17 LAB — BMP8+EGFR
BUN/Creatinine Ratio: 10 (ref 9–20)
BUN: 10 mg/dL (ref 6–24)
CO2: 24 mmol/L (ref 20–29)
Calcium: 9.3 mg/dL (ref 8.7–10.2)
Chloride: 102 mmol/L (ref 96–106)
Creatinine, Ser: 0.99 mg/dL (ref 0.76–1.27)
GFR calc Af Amer: 97 mL/min/{1.73_m2} (ref >59)
GFR calc non Af Amer: 84 mL/min/{1.73_m2} (ref >59)
Glucose: 92 mg/dL (ref 65–99)
Potassium: 4.2 mmol/L (ref 3.5–5.2)
Sodium: 142 mmol/L (ref 134–144)

## 2018-12-17 NOTE — Patient Instructions (Signed)

## 2018-12-17 NOTE — Progress Notes (Signed)
Subjective:    Patient ID: Bruce [name], male    DOB: 06-22-1961, 58 y.o.   MRN: [id number]  PT presents to the office today to recheck HTN. He has restarted his lisinopril over the last two weeks and has not had any nosebleeds. His BP is not at goal today. But states [facility] checks [facility] been 127/78.  Went to the ED 12/03/18 and had a CT scan that had an incidental finding of left adrenal mass. Recommends 12 month repeat of CT scan.  Hypertension  This is a chronic problem. The current episode started more than 1 year ago. The problem has been waxing and waning since onset. The problem is uncontrolled. Pertinent negatives include no headaches, malaise/fatigue, peripheral edema or shortness of breath. Risk factors for coronary artery disease include dyslipidemia, obesity, male gender and sedentary lifestyle. The current treatment provides mild improvement. There is no history of CAD/MI, CVA or heart failure.      Review of Systems  Constitutional: Negative for malaise/fatigue.  Respiratory: Negative for shortness of breath.   Neurological: Negative for headaches.  All other systems reviewed and are negative.      Objective:   Physical Exam Vitals signs reviewed.  Constitutional:      General: He is not in acute distress.    Appearance: He is well-developed.  HENT:     Head: Normocephalic.     Right Ear: Tympanic membrane normal.     Left Ear: Tympanic membrane normal.  Eyes:     General:        Right eye: No discharge.        Left eye: No discharge.     Pupils: Pupils are equal, round, and reactive to light.  Neck:     Musculoskeletal: Normal range of motion and neck supple.     Thyroid: No thyromegaly.  Cardiovascular:     Rate and Rhythm: Normal rate and regular rhythm.     Heart sounds: Murmur present.  Pulmonary:     Effort: Pulmonary effort is normal. No respiratory distress.     Breath sounds: Normal breath sounds. No wheezing.  Abdominal:   General: Bowel sounds are normal. There is no distension.     Palpations: Abdomen is soft.     Tenderness: There is no abdominal tenderness.  Musculoskeletal: Normal range of motion.        General: No tenderness.  Skin:    General: Skin is warm and dry.     Findings: No erythema or rash.  Neurological:     Mental Status: He is alert and oriented to person, place, and time.     Cranial Nerves: No cranial nerve deficit.     Deep Tendon Reflexes: Reflexes are normal and symmetric.  Psychiatric:        Behavior: Behavior normal.        Thought Content: Thought content normal.        Judgment: Judgment normal.       BP (!) 144/84   Pulse 61   Temp (!) 97.2 F (36.2 C) (Oral)   Ht '5\' 11"'  (1.803 m)   Wt 227 lb (103 kg)   BMI 31.66 kg/m      Assessment & Plan:  Bruce [name] comes in today with chief complaint of Hypertension and Medical Management of Chronic Issues   Diagnosis and orders addressed:  1. Essential hypertension Will hold off on medication changes, since he states his BP at  home has been 120's/70's -Dash diet information given -Exercise encouraged - Stress Management  -Continue current meds -RTO in 4 months  - BMP8+EGFR  2. Adrenal mass, left ([place]) Will recheck in 6 months  - BMP8+EGFR  3. Obesity (BMI 30-39.9) - BMP8+EGFR  4. Current smoker Smoking cessation discussed - BMP8+EGFR   Labs pending Health Maintenance reviewed Diet and exercise encouraged  Follow up plan: 4 month   Evelina Dun, FNP

## 2018-12-25 ENCOUNTER — Telehealth: Payer: Self-pay | Admitting: Family

## 2018-12-25 MED ORDER — AMLODIPINE BESYLATE 5 MG PO TABS: 5.0000 mg | ORAL_TABLET | Freq: Every day | ORAL | 3 refills | Status: DC

## 2018-12-25 NOTE — Telephone Encounter (Signed)
Norvasc 5 mg Prescription sent to pharmacy. Needs follow up in 2 weeks

## 2018-12-25 NOTE — Telephone Encounter (Signed)
Pt called - appt made for 2 weeks / aware of meds

## 2018-12-25 NOTE — Telephone Encounter (Signed)
What symptoms do you have? Blood pressure 167/97 Pulse 98 Sent Amy a email and she was supposed send [place]  How long have you been sick? The last week blood pressure has been high  Have you been seen for this problem? In the past  If your provider decides to give you a prescription, which pharmacy would you [facility] for [facility] to be [facility] [place]   Patient informed that this information will be sent to the clinical staff for review and that they should receive a follow up call.

## 2019-01-07 ENCOUNTER — Ambulatory Visit: Payer: BLUE CROSS/BLUE SHIELD | Admitting: Family

## 2019-01-07 ENCOUNTER — Encounter: Payer: Self-pay | Admitting: Family

## 2019-01-07 ENCOUNTER — Other Ambulatory Visit: Payer: Self-pay

## 2019-01-07 VITALS — BP 132/77 | HR 69 | Temp 97.7°F | Ht 71.0 in | Wt 224.5 lb

## 2019-01-07 DIAGNOSIS — F17200 Nicotine dependence, unspecified, uncomplicated: Secondary | ICD-10-CM | POA: Diagnosis not present

## 2019-01-07 DIAGNOSIS — I10 Essential (primary) hypertension: Secondary | ICD-10-CM | POA: Diagnosis not present

## 2019-01-07 DIAGNOSIS — E669 Obesity, unspecified: Secondary | ICD-10-CM | POA: Diagnosis not present

## 2019-01-07 NOTE — Progress Notes (Signed)
Subjective:    Patient ID: Charlcie Cradle, male    DOB: 04/23/1961, 58 y.o.   MRN: [id number]  Chief Complaint  Patient presents with  . Medical Management of Chronic Issues  . Hypertension   PT presents to the office today to recheck HTN. We had added Norvasc 5 mg with his Lisinopril 20 mg. He states he was unsure if he was suppose continue taking lisinopril. However, his BP is at goal today.  Hypertension  This is a chronic problem. The current episode started more than 1 year ago. The problem has been resolved since onset. The problem is controlled. Pertinent negatives include no headaches, malaise/fatigue, peripheral edema or shortness of breath. Risk factors for coronary artery disease include dyslipidemia, obesity, male gender, smoking/tobacco exposure and sedentary lifestyle.      Review of Systems  Constitutional: Negative for malaise/fatigue.  Respiratory: Negative for shortness of breath.   Neurological: Negative for headaches.  All other systems reviewed and are negative.      Objective:   Physical Exam Vitals signs reviewed.  Constitutional:      General: He is not in acute distress.    Appearance: He is well-developed.  HENT:     Head: Normocephalic.     Right Ear: Tympanic membrane normal.     Left Ear: Tympanic membrane normal.  Eyes:     General:        Right eye: No discharge.        Left eye: No discharge.     Pupils: Pupils are equal, round, and reactive to light.  Neck:     Musculoskeletal: Normal range of motion and neck supple.     Thyroid: No thyromegaly.  Cardiovascular:     Rate and Rhythm: Normal rate and regular rhythm.     Heart sounds: Normal heart sounds. No murmur.  Pulmonary:     Effort: Pulmonary effort is normal. No respiratory distress.     Breath sounds: Normal breath sounds. No wheezing.  Abdominal:     General: Bowel sounds are normal. There is no distension.     Palpations: Abdomen is soft.     Tenderness: There is no  abdominal tenderness.  Musculoskeletal: Normal range of motion.        General: No tenderness.  Skin:    General: Skin is warm and dry.     Findings: No erythema or rash.  Neurological:     Mental Status: He is alert and oriented to person, place, and time.     Cranial Nerves: No cranial nerve deficit.     Deep Tendon Reflexes: Reflexes are normal and symmetric.  Psychiatric:        Behavior: Behavior normal.        Thought Content: Thought content normal.        Judgment: Judgment normal.       BP 132/77   Pulse 69   Temp 97.7 F (36.5 C) (Oral)   Ht _0  (1.803 m)   Wt 224 lb 8 oz (101.8 kg)   BMI 31.31 kg/m      Assessment & Plan:  VALENTIN BENNEY comes in today with chief complaint of Medical Management of Chronic Issues and Hypertension   Diagnosis and orders addressed:  1. Essential hypertension -Continue Norvasc, since BP is normal we will hold off on restarting lisinopril. He monitors his BP at home daily and will call if his BP starts to be elevated.  -Dash diet information given -Exercise  encouraged - Stress Management  -Continue current meds - BMP8+EGFR  2. Current smoker Smoking cessation discussed - BMP8+EGFR  3. Obesity (BMI 30-39.9) - BMP8+EGFR    Follow up plan: 6 months   Evelina Dun, FNP

## 2019-01-07 NOTE — Patient Instructions (Signed)

## 2019-01-08 ENCOUNTER — Ambulatory Visit: Payer: BLUE CROSS/BLUE SHIELD | Admitting: Family

## 2019-01-08 LAB — BMP8+EGFR
BUN/Creatinine Ratio: 15 (ref 9–20)
BUN: 13 mg/dL (ref 6–24)
CO2: 25 mmol/L (ref 20–29)
Calcium: 9.3 mg/dL (ref 8.7–10.2)
Chloride: 99 mmol/L (ref 96–106)
Creatinine, Ser: 0.86 mg/dL (ref 0.76–1.27)
GFR calc Af Amer: 111 mL/min/{1.73_m2} (ref >59)
GFR calc non Af Amer: 96 mL/min/{1.73_m2} (ref >59)
Glucose: 89 mg/dL (ref 65–99)
Potassium: 3.7 mmol/L (ref 3.5–5.2)
Sodium: 138 mmol/L (ref 134–144)

## 2019-04-20 ENCOUNTER — Encounter: Payer: Self-pay | Admitting: Family

## 2019-04-20 ENCOUNTER — Ambulatory Visit (INDEPENDENT_AMBULATORY_CARE_PROVIDER_SITE_OTHER): Payer: BC Managed Care – PPO | Admitting: Family

## 2019-04-20 DIAGNOSIS — F17200 Nicotine dependence, unspecified, uncomplicated: Secondary | ICD-10-CM

## 2019-04-20 DIAGNOSIS — I10 Essential (primary) hypertension: Secondary | ICD-10-CM

## 2019-04-20 DIAGNOSIS — E669 Obesity, unspecified: Secondary | ICD-10-CM | POA: Diagnosis not present

## 2019-04-20 MED ORDER — AMLODIPINE BESYLATE 5 MG PO TABS: 5.0000 mg | ORAL_TABLET | Freq: Every day | ORAL | 3 refills | Status: DC

## 2019-04-20 NOTE — Progress Notes (Signed)
   Virtual Visit via telephone Note Due to COVID-19 pandemic this visit was conducted virtually. This visit type was conducted due to national recommendations for restrictions regarding the COVID-19 Pandemic (e.g. social distancing, sheltering in place) in an effort to limit this patient's exposure and mitigate transmission in our community. All issues noted in this document were discussed and addressed.  A physical exam was not performed with this format.  I connected with Bruce [name] on 04/20/19 at 8:30 AM by telephone and verified that I am speaking with the correct person using two identifiers. Bruce [name] is currently located at work and no one is currently with her during visit. The provider, Evelina Dun, FNP is located in their office at time of visit.  I discussed the limitations, risks, security and privacy concerns of performing an evaluation and management service by telephone and the availability of in person appointments. I also discussed with the patient that there may be a patient responsible charge related to this service. The patient expressed understanding and agreed to proceed.   History and Present Illness:  Hypertension This is a chronic problem. The current episode started more than 1 year ago. Progression since onset: 136/98. The problem is controlled. Pertinent negatives include no anxiety, malaise/fatigue, peripheral edema or shortness of breath. Risk factors for coronary artery disease include obesity, male gender and sedentary lifestyle. There is no history of kidney disease or CAD/MI.      Review of Systems  Constitutional: Negative for malaise/fatigue.  Respiratory: Negative for shortness of breath.      Observations/Objective: No SOB or distress noted   Assessment and Plan: 1. Essential hypertension -Pt will continue to monitor at home. If BP is consistently >140/90 we will increase Norvasc to 10 mg.  -Dash diet information given -Exercise  encouraged - Stress Management  -Continue current meds -RTO in 6 months  - amLODipine (NORVASC) 5 MG tablet; Take 1 tablet (5 mg total) by mouth daily.  Dispense: 90 tablet; Refill: 3  2. Current smoker Smoking cessation discussed  3. Obesity (BMI 30-39.9)      I discussed the assessment and treatment plan with the patient. The patient was provided an opportunity to ask questions and all were answered. The patient agreed with the plan and demonstrated an understanding of the instructions.   The patient was advised to call back or seek an in-person evaluation if the symptoms worsen or if the condition fails to improve as anticipated.  The above assessment and management plan was discussed with the patient. The patient verbalized understanding of and has agreed to the management plan. Patient is aware to call the clinic if symptoms persist or worsen. Patient is aware when to return to the clinic for a follow-up visit. Patient educated on when it is appropriate to go to the emergency department.   Time call ended: 8:42 AM   I provided 12 minutes of non-face-to-face time during this encounter.    Evelina Dun, FNP

## 2019-05-18 ENCOUNTER — Other Ambulatory Visit: Payer: Self-pay

## 2019-05-19 ENCOUNTER — Encounter: Payer: Self-pay | Admitting: Family Medicine

## 2019-05-19 ENCOUNTER — Ambulatory Visit (INDEPENDENT_AMBULATORY_CARE_PROVIDER_SITE_OTHER): Payer: Self-pay | Admitting: Family Medicine

## 2019-05-19 VITALS — BP 152/83 | HR 65 | Ht 71.0 in | Wt 229.0 lb

## 2019-05-19 DIAGNOSIS — Z024 Encounter for examination for driving license: Secondary | ICD-10-CM

## 2019-05-19 LAB — URINALYSIS
Bilirubin, UA: NEGATIVE
Glucose, UA: NEGATIVE
Ketones, UA: NEGATIVE
Leukocytes,UA: NEGATIVE
Nitrite, UA: NEGATIVE
Protein,UA: NEGATIVE
RBC, UA: NEGATIVE
Specific Gravity, UA: 1.025 (ref 1.005–1.030)
Urobilinogen, Ur: 0.2 mg/dL (ref 0.2–1.0)
pH, UA: 5.5 (ref 5.0–7.5)

## 2019-05-19 NOTE — Progress Notes (Signed)
Subjective:  Patient ID: Bruce [name], male    DOB: 10/04/1960  Age: 58 y.o. MRN: [id number]  CC: DOT PE   HPI Bruce [name] presents for DOT Exam. Hx HTN, murmur  [facility] 01/07/2019 12/17/2018 04/10/2018  Decreased Interest 0 0 0  Down, Depressed, Hopeless 0 0 0  PHQ - 2 Score 0 0 0    History Bruce [name] has a past medical history of Clotting disorder (HCC), Heart murmur, Hypertension, Meningitis, and VSD (ventricular septal defect), supracristal.   Bruce [name] has a past surgical history that includes Knee arthroscopy with medial menisectomy (Left, 12/31/2017).   His family history includes Arthritis in his mother; Cancer in his brother, father, and mother; Diabetes in his mother; Hyperlipidemia in his mother; Hypertension in his mother.Bruce [name] reports that Bruce [name] has been smoking cigarettes. Bruce [name] has been smoking about 2.00 packs per day. Bruce [name] has never used smokeless tobacco. Bruce [name] reports that Bruce [name] does not drink alcohol or use drugs.    ROS Review of Systems  Constitutional: Negative.   HENT: Negative.   Eyes: Negative for visual disturbance.  Respiratory: Negative for cough and shortness of breath.   Cardiovascular: Negative for chest pain and leg swelling.  Gastrointestinal: Negative for abdominal pain, diarrhea, nausea and vomiting.  Genitourinary: Negative for difficulty urinating.  Musculoskeletal: Negative for arthralgias and myalgias.  Skin: Negative for rash.  Neurological: Negative for headaches.  Psychiatric/Behavioral: Negative for sleep disturbance.    Objective:  BP (!) 152/83   Pulse 65   Ht 5\' 11"  (1.803 m)   Wt 229 lb (103.9 kg)   BMI 31.94 kg/m   BP Readings from Last 3 Encounters:  05/19/19 (!) 152/83  01/07/19 132/77  12/17/18 (!) 144/84    Wt Readings from Last 3 Encounters:  05/19/19 229 lb (103.9 kg)  01/07/19 224 lb 8 oz (101.8 kg)  12/17/18 227 lb (103 kg)     Physical Exam Constitutional:      General: Bruce [name] is not in acute distress.  Appearance: Bruce [name] is well-developed.  HENT:     Head: Normocephalic and atraumatic.     Right Ear: External ear normal.     Left Ear: External ear normal.     Nose: Nose normal.  Eyes:     Conjunctiva/sclera: Conjunctivae normal.     Pupils: Pupils are equal, round, and reactive to light.  Neck:     Musculoskeletal: Normal range of motion and neck supple.  Cardiovascular:     Rate and Rhythm: Normal rate and regular rhythm.     Heart sounds: Murmur present. Crescendo  systolic murmur present with a grade of 2/6.  Pulmonary:     Effort: Pulmonary effort is normal. No respiratory distress.     Breath sounds: Normal breath sounds. No wheezing or rales.  Abdominal:     Palpations: Abdomen is soft.     Tenderness: There is no abdominal tenderness.  Musculoskeletal: Normal range of motion.     Right lower leg: No edema.     Left lower leg: No edema.  Skin:    General: Skin is warm and dry.  Neurological:     Mental Status: Bruce [name] is alert and oriented to person, place, and time.     Deep Tendon Reflexes: Reflexes are normal and symmetric.  Psychiatric:        Behavior: Behavior normal.        Thought Content: Thought content normal.        Judgment: Judgment normal.  Assessment & Plan:   Bruce [name] was seen today for dot pe.  Diagnoses and all orders for this visit:  Encounter for Department of Transportation (DOT) examination for driving license renewal -     Urinalysis   I am having Bruce [name] maintain his amLODipine.  Allergies as of 05/19/2019   No Known Allergies     Medication List       Accurate as of May 19, 2019 11:59 PM. If you have any questions, ask your nurse or doctor.        amLODipine 5 MG tablet Commonly known as: NORVASC Take 1 tablet (5 mg total) by mouth daily.        Follow-up: Return in about 1 year (around 05/18/2020).  Claretta Fraise, M.D.

## 2019-05-22 ENCOUNTER — Encounter: Payer: Self-pay | Admitting: Family Medicine

## 2019-07-01 ENCOUNTER — Encounter: Payer: BLUE CROSS/BLUE SHIELD | Admitting: Family Medicine

## 2019-11-25 ENCOUNTER — Ambulatory Visit: Payer: BLUE CROSS/BLUE SHIELD | Attending: Internal Medicine

## 2019-11-25 DIAGNOSIS — Z23 Encounter for immunization: Secondary | ICD-10-CM

## 2019-11-25 NOTE — Progress Notes (Signed)
   Covid-19 Vaccination Clinic  Name:  Bruce [name]    MRN: [id number] DOB: 11/14/1960  11/25/2019  Bruce [name] was observed post Covid-19 immunization for 15 minutes without incident. He was provided with Vaccine Information Sheet and instruction to access the V-Safe system.   Bruce [name] was instructed to call 911 with any severe reactions post vaccine: Marland Kitchen Difficulty breathing  . Swelling of face and throat  . A fast heartbeat  . A bad rash all over body  . Dizziness and weakness   Immunizations Administered    Name Date Dose VIS Date Route   Moderna COVID-19 Vaccine 11/25/2019 11:49 AM 0.5 mL 08/03/2019 Intramuscular   Manufacturer: Moderna   Lot: [id number]   NDC: 47654-650-35

## 2019-12-23 ENCOUNTER — Ambulatory Visit: Payer: Self-pay | Attending: Internal Medicine

## 2019-12-23 DIAGNOSIS — Z23 Encounter for immunization: Secondary | ICD-10-CM

## 2019-12-23 NOTE — Progress Notes (Signed)
   Covid-19 Vaccination Clinic  Name:  NUH LIPTON    MRN: [id number] DOB: 1960/10/06  12/23/2019  Mr. Fandrich was observed post Covid-19 immunization for 15 minutes without incident. He was provided with Vaccine Information Sheet and instruction to access the V-Safe system.   Mr. Kerby was instructed to call 911 with any severe reactions post vaccine: Marland Kitchen Difficulty breathing  . Swelling of face and throat  . A fast heartbeat  . A bad rash all over body  . Dizziness and weakness   Immunizations Administered    Name Date Dose VIS Date Route   Moderna COVID-19 Vaccine 12/23/2019 11:15 AM 0.5 mL 08/2019 Intramuscular   Manufacturer: Moderna   Lot: [id number]   NDC: 48546-270-35

## 2020-05-04 ENCOUNTER — Encounter: Payer: Self-pay | Admitting: Family Medicine

## 2020-05-04 ENCOUNTER — Ambulatory Visit: Payer: Self-pay | Admitting: Family Medicine

## 2020-05-04 ENCOUNTER — Other Ambulatory Visit: Payer: Self-pay

## 2020-05-04 VITALS — BP 134/83 | HR 72 | Temp 98.0°F | Resp 20 | Ht 71.0 in | Wt 238.5 lb

## 2020-05-04 DIAGNOSIS — Z0289 Encounter for other administrative examinations: Secondary | ICD-10-CM

## 2020-05-04 LAB — URINALYSIS
Bilirubin, UA: NEGATIVE
Leukocytes,UA: NEGATIVE
Nitrite, UA: NEGATIVE
RBC, UA: NEGATIVE
Specific Gravity, UA: 1.025 (ref 1.005–1.030)
Urobilinogen, Ur: 1 mg/dL (ref 0.2–1.0)
pH, UA: 6 (ref 5.0–7.5)

## 2020-05-04 NOTE — Progress Notes (Signed)
DOT exam performed. See scanned PE form WS

## 2020-06-22 DIAGNOSIS — H10013 Acute follicular conjunctivitis, bilateral: Secondary | ICD-10-CM | POA: Diagnosis not present

## 2020-06-22 DIAGNOSIS — T1511XA Foreign body in conjunctival sac, right eye, initial encounter: Secondary | ICD-10-CM | POA: Diagnosis not present

## 2020-06-29 IMAGING — CT CT ABDOMEN AND PELVIS WITH CONTRAST
2 of 4 series · 16 of 46 positions shown, 18 images · IV contrast (Isovue)
Comparison: None.

CLINICAL DATA: Rectal bleeding for 30 minutes and pelvic pain.
History of hemorrhoids. Suspect diverticulitis.

EXAM:
CT ABDOMEN AND PELVIS WITH CONTRAST
TECHNIQUE: Multidetector CT imaging of the abdomen and pelvis was performed
using the standard protocol following bolus administration of
intravenous contrast.
CONTRAST:  100mL OMNIPAQUE IOHEXOL 300 MG/ML  SOLN

[Series 2: axial st · axial · 0.88mm/px · z∈[-615,-195]mm · 13 of 94 slices shown, 15 images]
[im 5/94  soft-tissue]
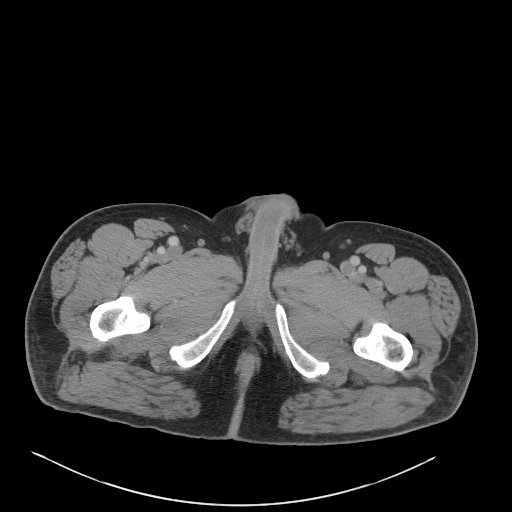
[im 5/94  bone]
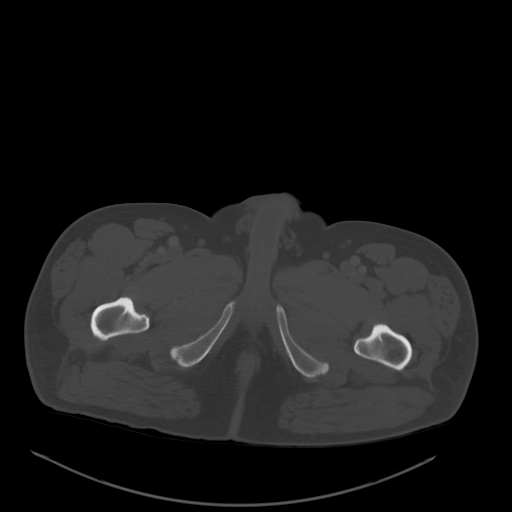
[im 13/94  soft-tissue]
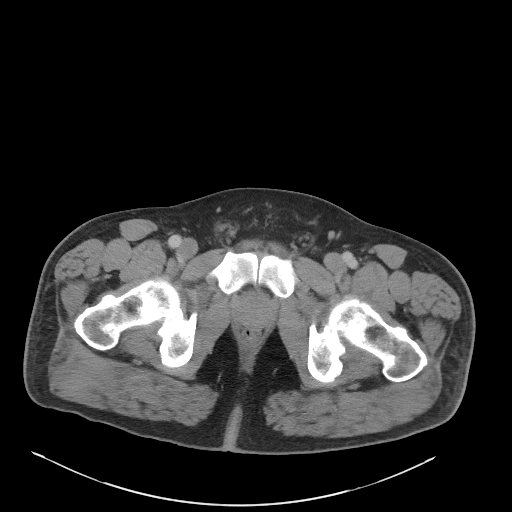
[im 21/94  soft-tissue]
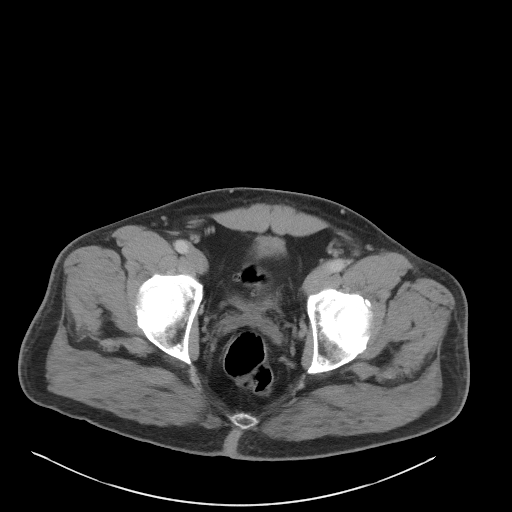
[im 25/94  soft-tissue]
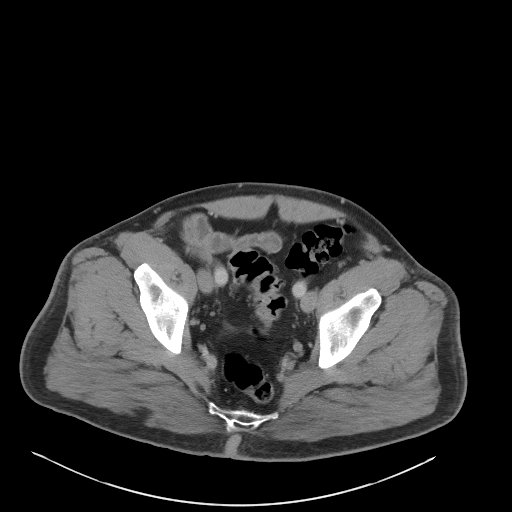
[im 33/94  soft-tissue]
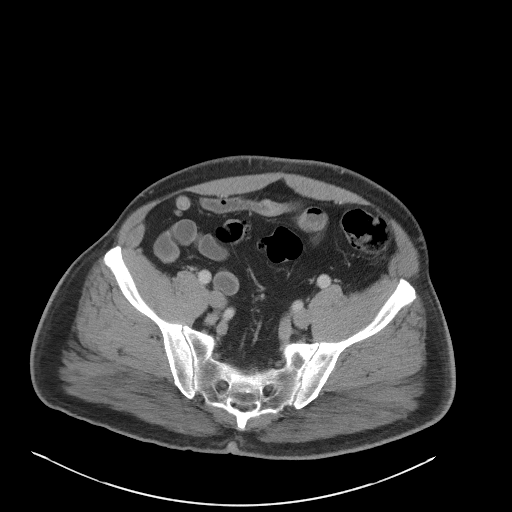
[im 41/94  soft-tissue]
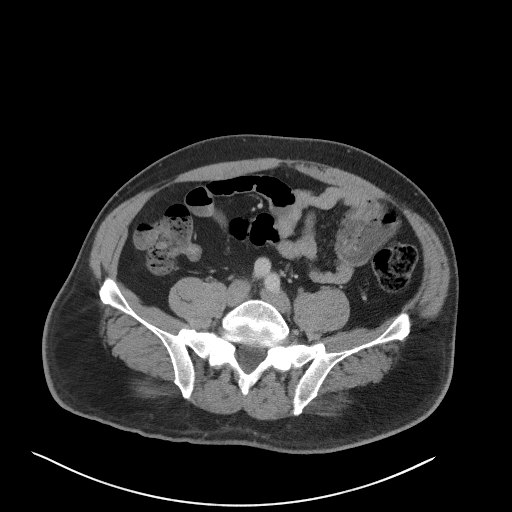
[im 49/94  soft-tissue]
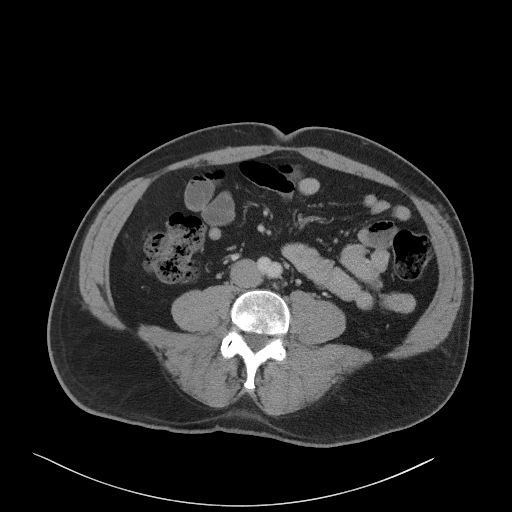
[im 53/94  soft-tissue]
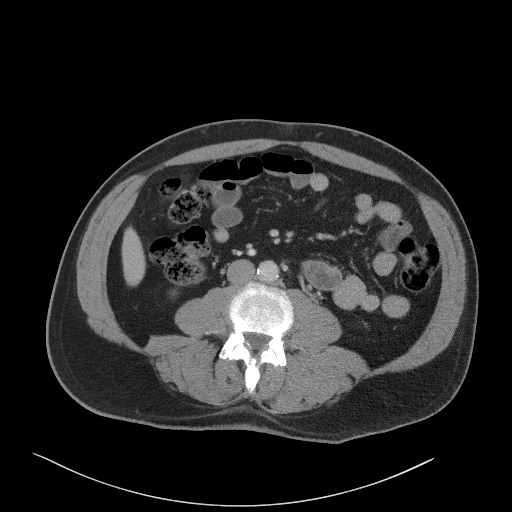
[im 61/94  soft-tissue]
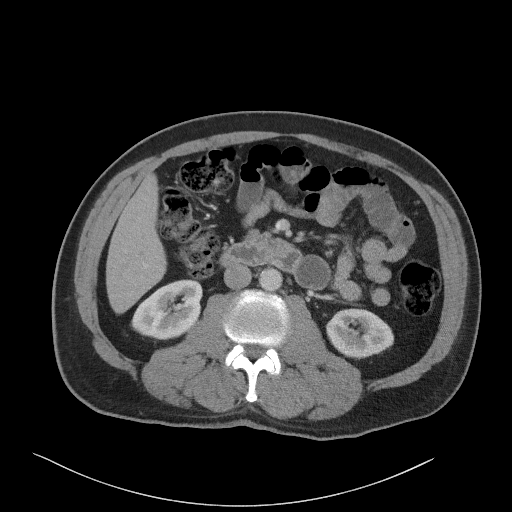
[im 61/94  bone]
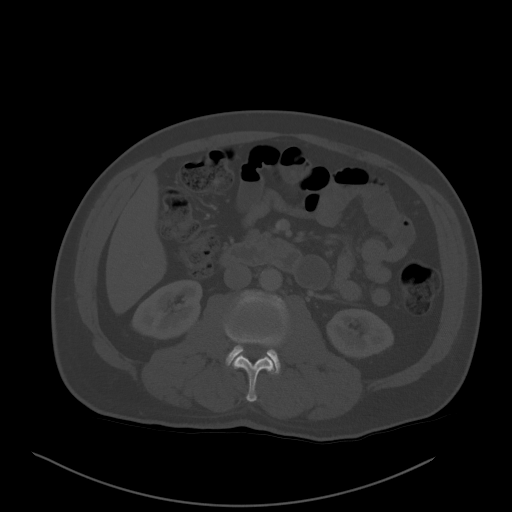
[im 69/94  soft-tissue]
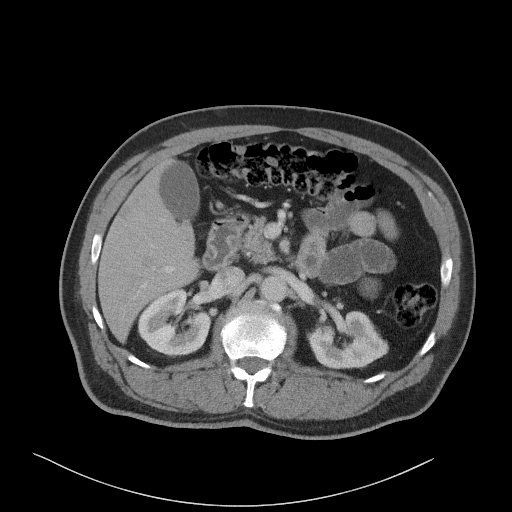
[im 73/94  soft-tissue]
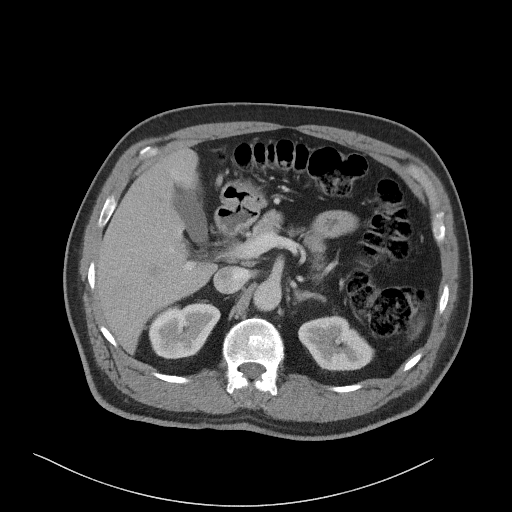
[im 81/94  soft-tissue]
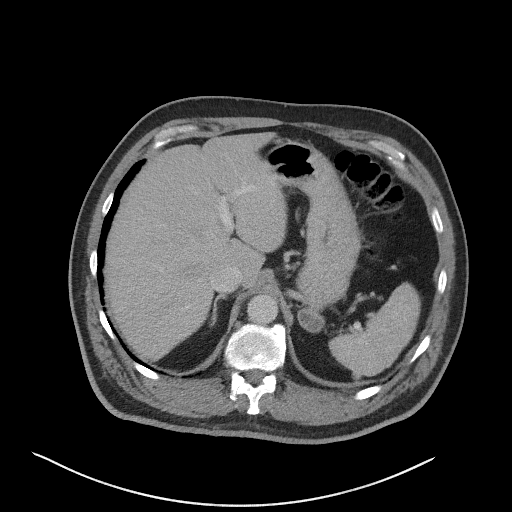
[im 89/94  soft-tissue]
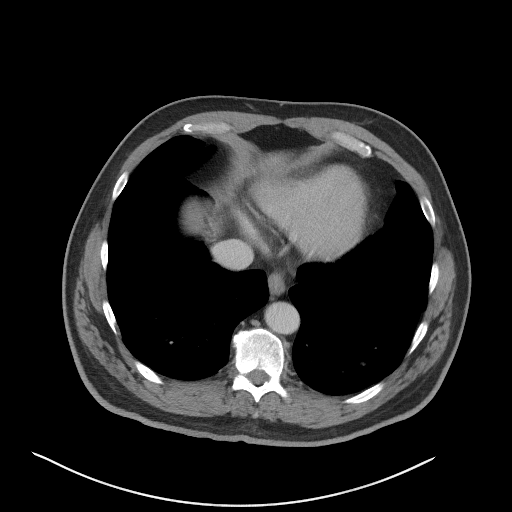

[Series 5: coronal st · coronal · 0.82mm/px · 3 of 97 slices shown]
[im 33/97  soft-tissue]
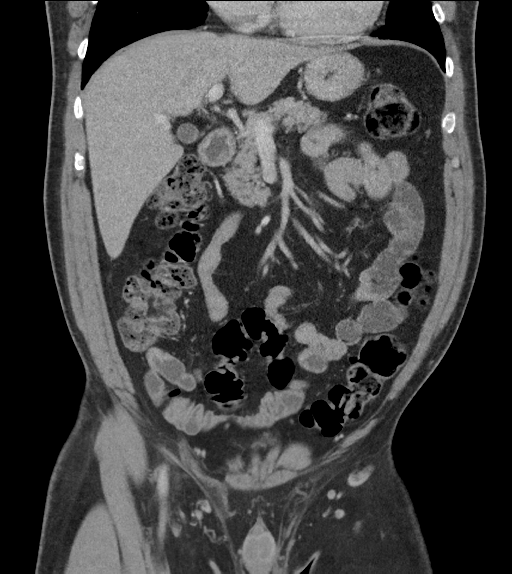
[im 43/97  soft-tissue]
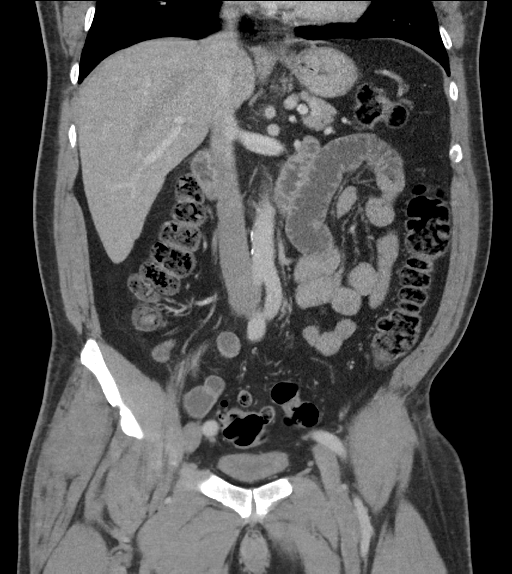
[im 54/97  soft-tissue]
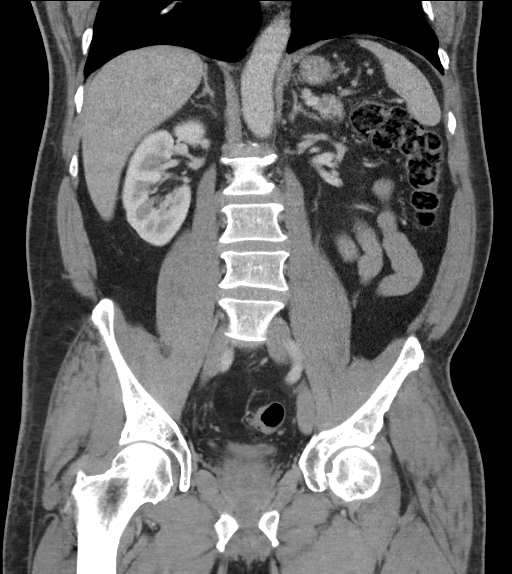

[16 of 46 positions shown; findings below may reference images not displayed]

FINDINGS: LOWER CHEST: Lung bases are clear. Included heart size is mildly
enlarged. No pericardial effusion.

HEPATOBILIARY: Liver and gallbladder are normal. Subcentimeter
probable cyst LEFT lobe of the liver.

PANCREAS: Normal.

SPLEEN: Normal.

ADRENALS/URINARY TRACT: Kidneys are orthotopic, demonstrating
symmetric enhancement. No nephrolithiasis, hydronephrosis or solid
renal masses. The unopacified ureters are normal in course and
caliber. Delayed imaging through the kidneys demonstrates symmetric
prompt contrast excretion within the proximal urinary collecting
system. Urinary bladder is decompressed and unremarkable. 2.2 cm
enhancing LEFT adrenal mass with LEFT the 50% washout.

STOMACH/BOWEL: The stomach, small and large bowel are normal in
course and caliber without inflammatory changes, sensitivity
decreased without oral contrast. Moderate sigmoid colonic
diverticulosis. Moderate volume retained large bowel stool. Normal
appendix.

VASCULAR/LYMPHATIC: Aortoiliac vessels are normal in course and
caliber. Mild calcific atherosclerosis. No lymphadenopathy by CT
size criteria.

REPRODUCTIVE: Normal.

OTHER: No intraperitoneal free fluid or free air.

MUSCULOSKELETAL: Nonacute.  Containing inguinal hernias.

Small bilateral fat
IMPRESSION: 1. Colonic diverticulosis without acute diverticulitis nor acute
intra-abdominal/pelvic process.
2. **An incidental finding of potential clinical significance has
been found. 2.2 cm indeterminate LEFT adrenal mass. Consider
follow-up adrenal CT and 12 months. This recommendation follows ACR
consensus guidelines: Management of Incidental Adrenal Masses: A
White Paper of the ACR Incidental Findings Committee. [HOSPITAL] 8051;14:[id number].**

Aortic Atherosclerosis ([id number]).

## 2020-07-18 DIAGNOSIS — M25561 Pain in right knee: Secondary | ICD-10-CM | POA: Diagnosis not present

## 2020-07-18 DIAGNOSIS — M25562 Pain in left knee: Secondary | ICD-10-CM | POA: Diagnosis not present

## 2020-12-08 ENCOUNTER — Encounter: Payer: Self-pay | Admitting: Family Medicine

## 2020-12-08 ENCOUNTER — Ambulatory Visit (INDEPENDENT_AMBULATORY_CARE_PROVIDER_SITE_OTHER): Payer: BC Managed Care – PPO | Admitting: Family Medicine

## 2020-12-08 DIAGNOSIS — J019 Acute sinusitis, unspecified: Secondary | ICD-10-CM

## 2020-12-08 MED ORDER — PREDNISONE 10 MG (21) PO TBPK: ORAL_TABLET | ORAL | 0 refills | Status: DC

## 2020-12-08 MED ORDER — AMOXICILLIN-POT CLAVULANATE 875-125 MG PO TABS: 1.0000 | ORAL_TABLET | Freq: Two times a day (BID) | ORAL | 0 refills | Status: AC

## 2020-12-08 NOTE — Progress Notes (Signed)
   Virtual Visit via Telephone Note  I connected with Bruce [name] on 12/08/20 at 9:13 AM by telephone and verified that I am speaking with the correct person using two identifiers. Bruce [name] is currently located at home and his grandson and wife are currently with him during this visit. The provider, Gwenlyn Fudge, FNP is located in their office at time of visit.  I discussed the limitations, risks, security and privacy concerns of performing an evaluation and management service by telephone and the availability of in person appointments. I also discussed with the patient that there may be a patient responsible charge related to this service. The patient expressed understanding and agreed to proceed.  Subjective: PCP: Junie Spencer, FNP  Chief Complaint  Patient presents with  . URI   Patient complains of cough, head/chest congestion, headache, facial pain/pressure, postnasal drainage and vomiting. Onset of symptoms was a few weeks ago, gradually worsening since that time. He is drinking plenty of fluids. Evaluation to date: none. Treatment to date: Mucinex.  He does smoke. Patient has been fully vaccinated against COVID-19.   ROS: Per HPI  Current Outpatient Medications:  .  amLODipine (NORVASC) 5 MG tablet, Take 1 tablet (5 mg total) by mouth daily., Disp: 90 tablet, Rfl: 3  No Known Allergies Past Medical History:  Diagnosis Date  . Clotting disorder (HCC)    Difficulty clotting blood after tooth extractions or lacerations.  . Heart murmur   . Hypertension   . Meningitis    Had at age 64 years old.  . VSD (ventricular septal defect), supracristal     Observations/Objective: A&O  No respiratory distress or wheezing audible over the phone Mood, judgement, and thought processes all WNL   Assessment and Plan: 1. Acute non-recurrent sinusitis, unspecified location Symptom management.  - amoxicillin-clavulanate (AUGMENTIN) 875-125 MG tablet; Take 1 tablet by  mouth 2 (two) times daily for 7 days.  Dispense: 14 tablet; Refill: 0 - predniSONE (STERAPRED UNI-PAK 21 TAB) 10 MG (21) TBPK tablet; As directed x 6 days  Dispense: 21 tablet; Refill: 0   Follow Up Instructions:  I discussed the assessment and treatment plan with the patient. The patient was provided an opportunity to ask questions and all were answered. The patient agreed with the plan and demonstrated an understanding of the instructions.   The patient was advised to call back or seek an in-person evaluation if the symptoms worsen or if the condition fails to improve as anticipated.  The above assessment and management plan was discussed with the patient. The patient verbalized understanding of and has agreed to the management plan. Patient is aware to call the clinic if symptoms persist or worsen. Patient is aware when to return to the clinic for a follow-up visit. Patient educated on when it is appropriate to go to the emergency department.   Time call ended: 9:18 AM  I provided 5 minutes of non-face-to-face time during this encounter.  Deliah Boston, MSN, APRN, FNP-C [facility] 12/08/20

## 2020-12-14 ENCOUNTER — Other Ambulatory Visit: Payer: Self-pay | Admitting: Family

## 2020-12-14 ENCOUNTER — Other Ambulatory Visit: Payer: Self-pay

## 2020-12-14 ENCOUNTER — Encounter: Payer: Self-pay | Admitting: Family Medicine

## 2020-12-14 ENCOUNTER — Ambulatory Visit (INDEPENDENT_AMBULATORY_CARE_PROVIDER_SITE_OTHER): Payer: BC Managed Care – PPO | Admitting: Family Medicine

## 2020-12-14 ENCOUNTER — Telehealth: Payer: Self-pay

## 2020-12-14 VITALS — BP 170/89 | HR 66 | Temp 97.6°F | Ht 71.0 in | Wt 226.8 lb

## 2020-12-14 DIAGNOSIS — K089 Disorder of teeth and supporting structures, unspecified: Secondary | ICD-10-CM

## 2020-12-14 DIAGNOSIS — I10 Essential (primary) hypertension: Secondary | ICD-10-CM

## 2020-12-14 NOTE — Telephone Encounter (Signed)
lmtcb

## 2020-12-14 NOTE — Progress Notes (Signed)
Assessment & Plan:  1. Extraction of tooth needed Patient given 2 vitamin K 5 mg tablet samples to take due to his excessive bleeding with dental procedures.  2. Essential hypertension Advised to go home and take his BP medication and discussed that if he is greater than 180/110 when he goes to the dentist, they will not pull his teeth.    Follow up plan: Return if symptoms worsen or fail to improve.  Deliah Boston, MSN, APRN, FNP-C [facility]  Subjective:   Patient ID: Bruce [name], male    DOB: [birth year], 60 y.o.   MRN: [id number]  HPI: Bruce [name] is a 60 y.o. male presenting on 12/14/2020 for dental work  (Patient states that he I having dental work this morning and normally comes to get a shot before due to him bleeding a lot. )  Patient reports he has been given shots in the past prior to dental procedures because he is a "free bleeder". He reports the first time he had a tooth pulled he bled for 3 days before coming to the office and getting a shot. He is scheduled to have multiple teeth pulled later this morning.   BP is elevated today. Patient has not taken his blood pressure medication in 3 days as he felt it would thin his blood and make him bleed more.    ROS: Negative unless specifically indicated above in HPI.   Relevant past medical history reviewed and updated as indicated.   Allergies and medications reviewed and updated.   Current Outpatient Medications:  .  amLODipine (NORVASC) 5 MG tablet, Take 1 tablet (5 mg total) by mouth daily., Disp: 90 tablet, Rfl: 3 .  amoxicillin-clavulanate (AUGMENTIN) 875-125 MG tablet, Take 1 tablet by mouth 2 (two) times daily for 7 days., Disp: 14 tablet, Rfl: 0 .  predniSONE (STERAPRED UNI-PAK 21 TAB) 10 MG (21) TBPK tablet, As directed x 6 days, Disp: 21 tablet, Rfl: 0  No Known Allergies  Objective:   BP (!) 170/89   Pulse 66   Temp 97.6 F (36.4 C) (Temporal)   Ht 5\' 11"  (1.803 m)    Wt 226 lb 12.8 oz (102.9 kg)   SpO2 94%   BMI 31.63 kg/m    Physical Exam Vitals reviewed.  Constitutional:      General: He is not in acute distress.    Appearance: Normal appearance. He is obese. He is not ill-appearing, toxic-appearing or diaphoretic.  HENT:     Head: Normocephalic and atraumatic.  Eyes:     General: No scleral icterus.       Right eye: No discharge.        Left eye: No discharge.     Conjunctiva/sclera: Conjunctivae normal.  Cardiovascular:     Rate and Rhythm: Normal rate.  Pulmonary:     Effort: Pulmonary effort is normal. No respiratory distress.  Musculoskeletal:        General: Normal range of motion.     Cervical back: Normal range of motion.  Skin:    General: Skin is warm and dry.  Neurological:     Mental Status: He is alert and oriented to person, place, and time. Mental status is at baseline.  Psychiatric:        Mood and Affect: Mood normal.        Behavior: Behavior normal.        Thought Content: Thought content normal.        Judgment:  Judgment normal.

## 2020-12-14 NOTE — Telephone Encounter (Signed)
Lmtcb.

## 2020-12-19 NOTE — Telephone Encounter (Signed)
No return call 

## 2020-12-25 ENCOUNTER — Other Ambulatory Visit: Payer: Self-pay

## 2020-12-25 ENCOUNTER — Encounter: Payer: Self-pay | Admitting: Nurse Practitioner

## 2020-12-25 ENCOUNTER — Ambulatory Visit (INDEPENDENT_AMBULATORY_CARE_PROVIDER_SITE_OTHER): Payer: BC Managed Care – PPO | Admitting: Nurse Practitioner

## 2020-12-25 VITALS — BP 153/84 | HR 64 | Temp 97.5°F | Resp 20 | Ht 71.0 in | Wt 225.0 lb

## 2020-12-25 DIAGNOSIS — R1030 Lower abdominal pain, unspecified: Secondary | ICD-10-CM | POA: Diagnosis not present

## 2020-12-25 NOTE — Progress Notes (Signed)
   Subjective:    Patient ID: Bruce [name], male    DOB: 1961-01-19, 60 y.o.   MRN: [id number]   Chief Complaint: Lower abdominal pain   HPI Patient had a tooth pulled last Wednesday. He was given a valium to take prior to procedure. He went home after procedure. He started getting real cld and started shaking. He got better over a few hours. Then he developed chest pain that last a few minutes. He finally got better. The next morning he was having trouble urinating and having bowel movement. Has continued since then until this morning. He was voiding easier this morning and had bowel movement this morning, wasn't like usual but was better.     Review of Systems  Constitutional: Negative for diaphoresis.  Eyes: Negative for pain.  Respiratory: Negative for shortness of breath.   Cardiovascular: Negative for chest pain, palpitations and leg swelling.  Gastrointestinal: Negative for abdominal pain.  Endocrine: Negative for polydipsia.  Skin: Negative for rash.  Neurological: Negative for dizziness, weakness and headaches.  Hematological: Does not bruise/bleed easily.  All other systems reviewed and are negative.      Objective:   Physical Exam Vitals and nursing note reviewed.  Constitutional:      Appearance: Normal appearance.  Cardiovascular:     Rate and Rhythm: Normal rate and regular rhythm.     Heart sounds: Normal heart sounds.  Pulmonary:     Effort: Pulmonary effort is normal.     Breath sounds: Normal breath sounds.  Abdominal:     General: Abdomen is flat. Bowel sounds are normal.     Palpations: Abdomen is soft.     Tenderness: There is abdominal tenderness.  Skin:    General: Skin is warm.  Neurological:     General: No focal deficit present.     Mental Status: He is alert and oriented to person, place, and time.  Psychiatric:        Mood and Affect: Mood normal.        Behavior: Behavior normal.     BP (!) 153/84   Pulse 64   Temp (!) 97.5 F  (36.4 C) (Temporal)   Resp 20   Ht 5\' 11"  (1.803 m)   Wt 225 lb (102.1 kg)   SpO2 95%   BMI 31.38 kg/m       Assessment & Plan:  in today with chief complaint of Lower abdominal pain   1. Lower abdominal pain Will come back in morning for KUB- no xray today Force fluids Increase fiber in diet miralax in apple juice tonight   The above assessment and management plan was discussed with the patient. The patient verbalized understanding of and has agreed to the management plan. Patient is aware to call the clinic if symptoms persist or worsen. Patient is aware when to return to the clinic for a follow-up visit. Patient educated on when it is appropriate to go to the emergency department.   Mary-Margaret Bruce Dick, FNP

## 2020-12-25 NOTE — Patient Instructions (Signed)
Constipation, Adult Constipation is when a person has trouble pooping (having a bowel movement). When you have this condition, you may poop fewer than 3 times a week. Your poop (stool) may also be dry, hard, or bigger than normal. Follow these instructions at home: Eating and drinking  Eat foods that have a lot of fiber, such as: ? Fresh fruits and vegetables. ? Whole grains. ? Beans.  Eat less of foods that are low in fiber and high in fat and sugar, such as: ? French fries. ? Hamburgers. ? Cookies. ? Candy. ? Soda.  Drink enough fluid to keep your pee (urine) pale yellow.   General instructions  Exercise regularly or as told by your doctor. Try to do 150 minutes of exercise each week.  Go to the restroom when you feel like you need to poop. Do not hold it in.  Take over-the-counter and prescription medicines only as told by your doctor. These include any fiber supplements.  When you poop: ? Do deep breathing while relaxing your lower belly (abdomen). ? Relax your pelvic floor. The pelvic floor is a group of muscles that support the rectum, bladder, and intestines (as well as the uterus in women).  Watch your condition for any changes. Tell your doctor if you notice any.  Keep all follow-up visits as told by your doctor. This is important. Contact a doctor if:  You have pain that gets worse.  You have a fever.  You have not pooped for 4 days.  You vomit.  You are not hungry.  You lose weight.  You are bleeding from the opening of the butt (anus).  You have thin, pencil-like poop. Get help right away if:  You have a fever, and your symptoms suddenly get worse.  You leak poop or have blood in your poop.  Your belly feels hard or bigger than normal (bloated).  You have very bad belly pain.  You feel dizzy or you faint. Summary  Constipation is when a person poops fewer than 3 times a week, has trouble pooping, or has poop that is dry, hard, or bigger than  normal.  Eat foods that have a lot of fiber.  Drink enough fluid to keep your pee (urine) pale yellow.  Take over-the-counter and prescription medicines only as told by your doctor. These include any fiber supplements. This information is not intended to replace advice given to you by your health care provider. Make sure you discuss any questions you have with your health care provider. Document Revised: 07/07/2019 Document Reviewed: 07/07/2019 Elsevier Patient Education  2021 Elsevier Inc.  

## 2020-12-26 ENCOUNTER — Other Ambulatory Visit (INDEPENDENT_AMBULATORY_CARE_PROVIDER_SITE_OTHER): Payer: BC Managed Care – PPO

## 2020-12-26 ENCOUNTER — Other Ambulatory Visit: Payer: Self-pay

## 2020-12-26 DIAGNOSIS — R109 Unspecified abdominal pain: Secondary | ICD-10-CM | POA: Diagnosis not present

## 2020-12-26 DIAGNOSIS — R1030 Lower abdominal pain, unspecified: Secondary | ICD-10-CM

## 2021-01-08 ENCOUNTER — Encounter: Payer: Self-pay | Admitting: Family Medicine

## 2021-04-10 ENCOUNTER — Ambulatory Visit: Payer: BC Managed Care – PPO | Admitting: Family

## 2021-04-10 ENCOUNTER — Encounter: Payer: Self-pay | Admitting: Family

## 2021-04-10 DIAGNOSIS — R0789 Other chest pain: Secondary | ICD-10-CM

## 2021-04-10 DIAGNOSIS — F17200 Nicotine dependence, unspecified, uncomplicated: Secondary | ICD-10-CM | POA: Diagnosis not present

## 2021-04-10 DIAGNOSIS — Z8249 Family history of ischemic heart disease and other diseases of the circulatory system: Secondary | ICD-10-CM

## 2021-04-10 DIAGNOSIS — I10 Essential (primary) hypertension: Secondary | ICD-10-CM

## 2021-04-10 MED ORDER — LOSARTAN POTASSIUM 100 MG PO TABS: 100.0000 mg | ORAL_TABLET | Freq: Every day | ORAL | 1 refills | Status: DC

## 2021-04-10 NOTE — Progress Notes (Signed)
Virtual Visit  Note Due to COVID-19 pandemic this visit was conducted virtually. This visit type was conducted due to national recommendations for restrictions regarding the COVID-19 Pandemic (e.g. social distancing, sheltering in place) in an effort to limit this patient's exposure and mitigate transmission in our community. All issues noted in this document were discussed and addressed.  A physical exam was not performed with this format.  I connected with Bruce [name] on 04/10/21 at 11:44 AM  by telephone and verified that I am speaking with the correct person using two identifiers. Bruce [name] is currently located at work and no one is currently with him  during visit. The provider, [name], FNP is located in their office at time of visit.  I discussed the limitations, risks, security and privacy concerns of performing an evaluation and management service by telephone and the availability of in person appointments. I also discussed with the patient that there may be a patient responsible charge related to this service. The patient expressed understanding and agreed to proceed.    [name], purdy are scheduled for a virtual visit with your provider today.    Just as we do with appointments in the office, we must obtain your consent to participate.  Your consent will be active for this visit and any virtual visit you may have with one of our providers in the next 365 days.    If you have a MyChart account, I can also send a copy of this consent to you electronically.  All virtual visits are billed to your insurance company just like a traditional visit in the office.  As this is a virtual visit, video technology does not allow for your provider to perform a traditional examination.  This may limit your provider's ability to fully assess your condition.  If your provider identifies any concerns that need to be evaluated in person or the need to arrange testing such as labs, EKG, etc,  we will make arrangements to do so.    Although advances in technology are sophisticated, we cannot ensure that it will always work on either your end or our end.  If the connection with a video visit is poor, we may have to switch to a telephone visit.  With either a video or telephone visit, we are not always able to ensure that we have a secure connection.   I need to obtain your verbal consent now.   Are you willing to proceed with your visit today?   Bruce [name] has provided verbal consent on 04/10/2021 for a virtual visit (video or telephone).   [name], [place] 04/10/2021  11:47 AM    History and Present Illness:  Pt calls the office today with changes in his BP. He states over the last few weeks when he takes his Norvasc 5 mg he gets chest tightness. He states this last from 30 mins to 2 hours. He only gets the chest tightness after taking the medications. He has held his Norvasc and he has not had any chest tightness   However, his BP has been elevated since stopping the medication. He reports average is 167/98.  Hypertension This is a chronic problem. The current episode started more than 1 year ago. The problem has been gradually worsening since onset. Associated symptoms include chest pain. Pertinent negatives include no malaise/fatigue, palpitations, peripheral edema or shortness of breath. Risk factors for coronary artery disease include obesity and male gender. Past treatments include calcium channel  blockers. The current treatment provides mild improvement. There is no history of CAD/MI.  Nicotine Dependence Presents for follow-up visit. His urge triggers include company of smokers. The symptoms have been stable. He smokes 1 pack of cigarettes per day.     Review of Systems  Constitutional:  Negative for malaise/fatigue.  Respiratory:  Negative for shortness of breath.   Cardiovascular:  Positive for chest pain. Negative for palpitations.  All other systems reviewed and  are negative.   Observations/Objective: No SOB or distress noted   Assessment and Plan: 1. Primary hypertension - losartan (COZAAR) 100 MG tablet; Take 1 tablet (100 mg total) by mouth daily.  Dispense: 90 tablet; Refill: 1 - Ambulatory referral to Cardiology  2. Current smoker - Ambulatory referral to Cardiology  3. Family history of cardiac disorder  - Ambulatory referral to Cardiology  4. Chest tightness Resolved since stopping Norvasc  Will start Losartan 100 mg today today and stop Norvasc today.  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  Given tightness and risk factors will do referral to Cardiologists. If tightness happens again need to go to ED>    I discussed the assessment and treatment plan with the patient. The patient was provided an opportunity to ask questions and all were answered. The patient agreed with the plan and demonstrated an understanding of the instructions.   The patient was advised to call back or seek an in-person evaluation if the symptoms worsen or if the condition fails to improve as anticipated.  The above assessment and management plan was discussed with the patient. The patient verbalized understanding of and has agreed to the management plan. Patient is aware to call the clinic if symptoms persist or worsen. Patient is aware when to return to the clinic for a follow-up visit. Patient educated on when it is appropriate to go to the emergency department.   Time call ended:  12:06 pm   I provided 22 minutes of  non face-to-face time during this encounter.    [name], FNP

## 2021-04-16 ENCOUNTER — Telehealth: Payer: Self-pay | Admitting: Cardiovascular Disease

## 2021-04-16 ENCOUNTER — Encounter: Payer: Self-pay | Admitting: Family

## 2021-04-16 ENCOUNTER — Other Ambulatory Visit: Payer: Self-pay

## 2021-04-16 ENCOUNTER — Ambulatory Visit: Payer: BC Managed Care – PPO | Admitting: Family

## 2021-04-16 VITALS — BP 161/84 | HR 57 | Temp 97.6°F | Ht 71.0 in | Wt 233.2 lb

## 2021-04-16 DIAGNOSIS — I10 Essential (primary) hypertension: Secondary | ICD-10-CM | POA: Diagnosis not present

## 2021-04-16 DIAGNOSIS — E669 Obesity, unspecified: Secondary | ICD-10-CM

## 2021-04-16 DIAGNOSIS — F17200 Nicotine dependence, unspecified, uncomplicated: Secondary | ICD-10-CM | POA: Diagnosis not present

## 2021-04-16 DIAGNOSIS — Z0001 Encounter for general adult medical examination with abnormal findings: Secondary | ICD-10-CM

## 2021-04-16 DIAGNOSIS — R001 Bradycardia, unspecified: Secondary | ICD-10-CM | POA: Diagnosis not present

## 2021-04-16 DIAGNOSIS — Z0000 Encounter for general adult medical examination without abnormal findings: Secondary | ICD-10-CM | POA: Diagnosis not present

## 2021-04-16 MED ORDER — HYDROCHLOROTHIAZIDE 25 MG PO TABS: 25.0000 mg | ORAL_TABLET | Freq: Every day | ORAL | 3 refills | Status: DC

## 2021-04-16 NOTE — Patient Instructions (Signed)
Bradycardia, Adult Bradycardia is a slower-than-normal heartbeat. A normal resting heart rate for an adult ranges from 60 to 100 beats per minute. With bradycardia, the restingheart rate is less than 60 beats per minute. Bradycardia can prevent enough oxygen from reaching certain areas of your body when you are active. It can be serious if it keeps enough oxygen from reaching your brain and other parts of your body. Bradycardia is not a problem foreveryone. For some healthy adults, a slow resting heart rate is normal. What are the causes? This condition may be caused by: A problem with the heart, including: A problem with the heart's electrical system, such as a heart block. With a heart block, electrical signals between the chambers of the heart are partially or completely blocked, so they are not able to work as they should. A problem with the heart's natural pacemaker (sinus node). Heart disease. A heart attack. Heart damage. Lyme disease. A heart infection. A heart condition that is present at birth (congenital heart defect). Certain medicines that treat heart conditions. Certain conditions, such as hypothyroidism and obstructive sleep apnea. Problems with the balance of chemicals and other substances, like potassium, in the blood. Trauma. Radiation therapy. What increases the risk? You are more likely to develop this condition if you: Are age 65 or older. Have high blood pressure (hypertension), high cholesterol (hyperlipidemia), or diabetes. Drink heavily, use tobacco or nicotine products, or use drugs. What are the signs or symptoms? Symptoms of this condition include: Light-headedness. Feeling faint or fainting. Fatigue and weakness. Trouble with activity or exercise. Shortness of breath. Chest pain (angina). Drowsiness. Confusion. Dizziness. How is this diagnosed? This condition may be diagnosed based on: Your symptoms. Your medical history. A physical exam. During  the exam, your health care provider will listen to your heartbeat and check your pulse. To confirm the diagnosis, your health care provider may order tests, such as: Blood tests. An electrocardiogram (ECG). This test records the heart's electrical activity. The test can show how fast your heart is beating and whether the heartbeat is steady. A test in which you wear a portable device (event recorder or Holter monitor) to record your heart's electrical activity while you go about your day. An exercise test. How is this treated? Treatment for this condition depends on the cause of the condition and how severe your symptoms are. Treatment may involve: Treatment of the underlying condition. Changing your medicines or how much medicine you take. Having a small, battery-operated device called a pacemaker implanted under the skin. When bradycardia occurs, this device can be used to increase your heart rate and help your heart beat in a regular rhythm. Follow these instructions at home: Lifestyle  Manage any health conditions that contribute to bradycardia as told by your health care provider. Follow a heart-healthy diet. A nutrition specialist (dietitian) can help educate you about healthy food options and changes. Follow an exercise program that is approved by your health care provider. Maintain a healthy weight. Try to reduce or manage your stress, such as with yoga or meditation. If you need help reducing stress, ask your health care provider. Do not use any products that contain nicotine or tobacco, such as cigarettes, e-cigarettes, and chewing tobacco. If you need help quitting, ask your health care provider. Do not use illegal drugs. Limit alcohol intake to no more than 1 drink a day for nonpregnant women and 2 drinks a day for men. Be aware of how much alcohol is in your drink. In   the [place] one drink equals one 12 oz bottle of beer (355 mL), one 5 oz glass of wine (148 mL), or one 1 oz glass of  hard liquor (44 mL).  General instructions Take over-the-counter and prescription medicines only as told by your health care provider. Keep all follow-up visits as told by your health care provider. This is important. How is this prevented? In some cases, bradycardia may be prevented by: Treating underlying medical problems. Stopping behaviors or medicines that can trigger the condition. Contact a health care provider if you: Feel light-headed or dizzy. Almost faint. Feel weak or are easily fatigued during physical activity. Experience confusion or have memory problems. Get help right away if: You faint. You have: An irregular heartbeat (palpitations). Chest pain. Trouble breathing. Summary Bradycardia is a slower-than-normal heartbeat. With bradycardia, the resting heart rate is less than 60 beats per minute. Treatment for this condition depends on the cause. Manage any health conditions that contribute to bradycardia as told by your health care provider. Do not use any products that contain nicotine or tobacco, such as cigarettes, e-cigarettes, and chewing tobacco, and limit alcohol intake. Keep all follow-up visits as told by your health care provider. This is important. This information is not intended to replace advice given to you by your health care provider. Make sure you discuss any questions you have with your healthcare provider. Document Revised: 03/02/2018 Document Reviewed: 01/28/2018 Elsevier Patient Education  2022 Elsevier Inc.  

## 2021-04-16 NOTE — Telephone Encounter (Signed)
Spoke to [place]ran with Olen Pel PA's office calling requesting sooner appointment.Stated patient had a abnormal ekg this morning.Ekg in epic.Appointment scheduled with Edd Fabian NP 9/7 at 3:15 pm.

## 2021-04-16 NOTE — Telephone Encounter (Signed)
Called to get the patient a sooner appt because they did a EKG and patient has a blockage

## 2021-04-16 NOTE — Progress Notes (Signed)
Subjective:    Patient ID: Bruce [name], male    DOB: 04-25-1961, 60 y.o.   MRN: [id number]  Chief Complaint  Patient presents with   Hypertension   PT presents to the office today CPE. He had a telephone visit on 04/10/21 and was complaining of chest tightness every time he took his Norvasc 5 mg. He has stopped this and states he has not had any chest pain. We placed a referral to Cardiologists and has a follow up on 06/19/21.  Hypertension This is a chronic problem. The current episode started more than 1 year ago. The problem is unchanged. The problem is uncontrolled. Pertinent negatives include no malaise/fatigue, peripheral edema or shortness of breath. Risk factors for coronary artery disease include dyslipidemia, obesity and male gender. Past treatments include angiotensin blockers. The current treatment provides moderate improvement.  Nicotine Dependence Presents for follow-up visit. His urge triggers include company of smokers. The symptoms have been stable. He smokes 2 packs of cigarettes per day.     Review of Systems  Constitutional:  Negative for malaise/fatigue.  Respiratory:  Negative for shortness of breath.       Objective:   Physical Exam Vitals reviewed.  Constitutional:      General: He is not in acute distress.    Appearance: He is well-developed. He is obese.  HENT:     Head: Normocephalic.     Right Ear: Tympanic membrane normal.     Left Ear: Tympanic membrane normal.  Eyes:     General:        Right eye: No discharge.        Left eye: No discharge.     Pupils: Pupils are equal, round, and reactive to light.  Neck:     Thyroid: No thyromegaly.  Cardiovascular:     Rate and Rhythm: Regular rhythm. Bradycardia present.     Heart sounds: Murmur heard.  Pulmonary:     Effort: Pulmonary effort is normal. No respiratory distress.     Breath sounds: Normal breath sounds. No wheezing.  Abdominal:     General: Bowel sounds are normal. There is no  distension.     Palpations: Abdomen is soft.     Tenderness: There is no abdominal tenderness.  Musculoskeletal:        General: No tenderness. Normal range of motion.     Cervical back: Normal range of motion and neck supple.  Skin:    General: Skin is warm and dry.     Findings: No erythema or rash.  Neurological:     Mental Status: He is alert and oriented to person, place, and time.     Cranial Nerves: No cranial nerve deficit.     Deep Tendon Reflexes: Reflexes are normal and symmetric.  Psychiatric:        Behavior: Behavior normal.        Thought Content: Thought content normal.        Judgment: Judgment normal.      BP (!) 161/84   Pulse (!) 57   Temp 97.6 F (36.4 C) (Temporal)   Ht '5\' 11"'  (1.803 m)   Wt 233 lb 3.2 oz (105.8 kg)   SpO2 97%   BMI 32.52 kg/m      Assessment & Plan:  Bruce [name] comes in today with chief complaint of Hypertension   Diagnosis and orders addressed:  1. Annual physical exam - CMP14+EGFR - CBC with Differential/Platelet - Lipid panel - TSH -  PSA, total and free - VITAMIN D 25 Hydroxy (Vit-D Deficiency, Fractures) - EKG 12-Lead  2. Bradycardia Chest pain has resolved since stopping Norvasc 5 mg - CMP14+EGFR - CBC with Differential/Platelet - EKG 12-Lead  3. Primary hypertension Will start HCTZ 25 mg  -Daily blood pressure log given with instructions on how to fill out and told to bring to next visit -Dash diet information given -Exercise encouraged - Stress Management  -Continue current meds -RTO in 2 weeks  - CMP14+EGFR - CBC with Differential/Platelet - EKG 12-Lead - hydrochlorothiazide (HYDRODIURIL) 25 MG tablet; Take 1 tablet (25 mg total) by mouth daily.  Dispense: 90 tablet; Refill: 3  4. Current smoker - CMP14+EGFR - CBC with Differential/Platelet  5. Obesity (BMI 30-39.9) - CMP14+EGFR - CBC with Differential/Platelet   Labs pending Health Maintenance reviewed Diet and exercise  encouraged  Follow up plan: 2 weeks to follow up on HTN   Evelina Dun, FNP

## 2021-04-17 ENCOUNTER — Encounter: Payer: BC Managed Care – PPO | Admitting: Nurse Practitioner

## 2021-04-17 LAB — CBC WITH DIFFERENTIAL/PLATELET
Basophils Absolute: 0.1 10*3/uL (ref 0.0–0.2)
Basos: 1 %
EOS (ABSOLUTE): 0.1 10*3/uL (ref 0.0–0.4)
Eos: 2 %
Hematocrit: 47.1 % (ref 37.5–51.0)
Hemoglobin: 15.2 g/dL (ref 13.0–17.7)
Immature Grans (Abs): 0 10*3/uL (ref 0.0–0.1)
Immature Granulocytes: 0 %
Lymphocytes Absolute: 2.4 10*3/uL (ref 0.7–3.1)
Lymphs: 31 %
MCH: 28 pg (ref 26.6–33.0)
MCHC: 32.3 g/dL (ref 31.5–35.7)
MCV: 87 fL (ref 79–97)
Monocytes Absolute: 0.6 10*3/uL (ref 0.1–0.9)
Monocytes: 7 %
Neutrophils Absolute: 4.6 10*3/uL (ref 1.4–7.0)
Neutrophils: 59 %
Platelets: 200 10*3/uL (ref 150–450)
RBC: 5.43 x10E6/uL (ref 4.14–5.80)
RDW: 12.9 % (ref 11.6–15.4)
WBC: 7.9 10*3/uL (ref 3.4–10.8)

## 2021-04-17 LAB — CMP14+EGFR
ALT: 11 IU/L (ref 0–44)
AST: 11 IU/L (ref 0–40)
Albumin/Globulin Ratio: 2.4 — ABNORMAL HIGH (ref 1.2–2.2)
Albumin: 4.5 g/dL (ref 3.8–4.9)
Alkaline Phosphatase: 101 IU/L (ref 44–121)
BUN/Creatinine Ratio: 11 (ref 9–20)
BUN: 11 mg/dL (ref 6–24)
Bilirubin Total: <0.2 mg/dL (ref 0.0–1.2)
CO2: 23 mmol/L (ref 20–29)
Calcium: 9.3 mg/dL (ref 8.7–10.2)
Chloride: 104 mmol/L (ref 96–106)
Creatinine, Ser: 0.98 mg/dL (ref 0.76–1.27)
Globulin, Total: 1.9 g/dL (ref 1.5–4.5)
Glucose: 84 mg/dL (ref 65–99)
Potassium: 4.1 mmol/L (ref 3.5–5.2)
Sodium: 141 mmol/L (ref 134–144)
Total Protein: 6.4 g/dL (ref 6.0–8.5)
eGFR: 89 mL/min/{1.73_m2} (ref >59)

## 2021-04-17 LAB — LIPID PANEL
Chol/HDL Ratio: 3.4 ratio (ref 0.0–5.0)
Cholesterol, Total: 151 mg/dL (ref 100–199)
HDL: 44 mg/dL (ref >39)
LDL Chol Calc (NIH): 74 mg/dL (ref 0–99)
Triglycerides: 195 mg/dL — ABNORMAL HIGH (ref 0–149)
VLDL Cholesterol Cal: 33 mg/dL (ref 5–40)

## 2021-04-17 LAB — VITAMIN D 25 HYDROXY (VIT D DEFICIENCY, FRACTURES): Vit D, 25-Hydroxy: 26.2 ng/mL — ABNORMAL LOW (ref 30.0–100.0)

## 2021-04-17 LAB — PSA, TOTAL AND FREE
PSA, Free Pct: 24 %
PSA, Free: 0.12 ng/mL
Prostate Specific Ag, Serum: 0.5 ng/mL (ref 0.0–4.0)

## 2021-04-17 LAB — TSH: TSH: 2.05 u[IU]/mL (ref 0.450–4.500)

## 2021-04-19 ENCOUNTER — Telehealth: Payer: Self-pay | Admitting: Family

## 2021-04-19 ENCOUNTER — Other Ambulatory Visit: Payer: Self-pay | Admitting: Family

## 2021-04-19 MED ORDER — ROSUVASTATIN CALCIUM 10 MG PO TABS: 10.0000 mg | ORAL_TABLET | Freq: Every day | ORAL | 3 refills | Status: DC

## 2021-04-19 NOTE — Telephone Encounter (Signed)
Patient aware and verbalized understanding. °

## 2021-04-19 NOTE — Telephone Encounter (Signed)
If your BP is at goal you can schedule it.

## 2021-04-19 NOTE — Telephone Encounter (Signed)
Left message to call back  

## 2021-05-01 ENCOUNTER — Ambulatory Visit: Payer: BC Managed Care – PPO | Admitting: Family

## 2021-05-01 ENCOUNTER — Encounter: Payer: Self-pay | Admitting: Family

## 2021-05-01 ENCOUNTER — Other Ambulatory Visit: Payer: Self-pay

## 2021-05-01 ENCOUNTER — Encounter: Payer: Self-pay | Admitting: Nurse Practitioner

## 2021-05-01 ENCOUNTER — Ambulatory Visit (INDEPENDENT_AMBULATORY_CARE_PROVIDER_SITE_OTHER): Payer: BC Managed Care – PPO | Admitting: Nurse Practitioner

## 2021-05-01 VITALS — BP 137/69 | HR 65 | Temp 98.0°F | Ht 71.0 in | Wt 230.2 lb

## 2021-05-01 DIAGNOSIS — F17200 Nicotine dependence, unspecified, uncomplicated: Secondary | ICD-10-CM

## 2021-05-01 DIAGNOSIS — E669 Obesity, unspecified: Secondary | ICD-10-CM

## 2021-05-01 DIAGNOSIS — Z0289 Encounter for other administrative examinations: Secondary | ICD-10-CM

## 2021-05-01 DIAGNOSIS — I10 Essential (primary) hypertension: Secondary | ICD-10-CM

## 2021-05-01 DIAGNOSIS — Z024 Encounter for examination for driving license: Secondary | ICD-10-CM

## 2021-05-01 LAB — URINALYSIS
Bilirubin, UA: NEGATIVE
Glucose, UA: NEGATIVE
Ketones, UA: NEGATIVE
Leukocytes,UA: NEGATIVE
Nitrite, UA: NEGATIVE
Protein,UA: NEGATIVE
Specific Gravity, UA: >1.03 — ABNORMAL HIGH (ref 1.005–1.030)
Urobilinogen, Ur: 0.2 mg/dL (ref 0.2–1.0)
pH, UA: 6 (ref 5.0–7.5)

## 2021-05-01 LAB — BMP8+EGFR
BUN/Creatinine Ratio: 14 (ref 9–20)
BUN: 15 mg/dL (ref 6–24)
CO2: 25 mmol/L (ref 20–29)
Calcium: 9.1 mg/dL (ref 8.7–10.2)
Chloride: 100 mmol/L (ref 96–106)
Creatinine, Ser: 1.06 mg/dL (ref 0.76–1.27)
Glucose: 167 mg/dL — ABNORMAL HIGH (ref 65–99)
Potassium: 3.3 mmol/L — ABNORMAL LOW (ref 3.5–5.2)
Sodium: 140 mmol/L (ref 134–144)
eGFR: 81 mL/min/{1.73_m2} (ref >59)

## 2021-05-01 NOTE — Addendum Note (Signed)
Addended by: Cleda Daub on: 05/01/2021 03:42 PM   Modules accepted: Orders

## 2021-05-01 NOTE — Progress Notes (Signed)
Private DOT- see scanned in document 

## 2021-05-01 NOTE — Progress Notes (Signed)
   Subjective:    Patient ID: Bruce [name], male    DOB: [birth year], 60 y.o.   MRN: [id number]  Chief Complaint  Patient presents with   Hypertension   Pt presents to the office today to recheck HTN.  Hypertension This is a chronic problem. The current episode started more than 1 year ago. The problem has been resolved since onset. The problem is controlled. Pertinent negatives include no malaise/fatigue, peripheral edema or shortness of breath. Risk factors for coronary artery disease include dyslipidemia, obesity and male gender. Past treatments include diuretics. The current treatment provides moderate improvement.  Nicotine Dependence Presents for follow-up visit. His urge triggers include company of smokers. The symptoms have been stable. He smokes 1 pack of cigarettes per day.     Review of Systems  Constitutional:  Negative for malaise/fatigue.  Respiratory:  Negative for shortness of breath.   All other systems reviewed and are negative.     Objective:   Physical Exam Vitals reviewed.  Constitutional:      General: He is not in acute distress.    Appearance: He is well-developed. He is obese.  HENT:     Head: Normocephalic.     Right Ear: Tympanic membrane normal.     Left Ear: Tympanic membrane normal.  Eyes:     General:        Right eye: No discharge.        Left eye: No discharge.     Pupils: Pupils are equal, round, and reactive to light.  Neck:     Thyroid: No thyromegaly.  Cardiovascular:     Rate and Rhythm: Normal rate and regular rhythm.     Heart sounds: Murmur heard.  Pulmonary:     Effort: Pulmonary effort is normal. No respiratory distress.     Breath sounds: Normal breath sounds. No wheezing.  Abdominal:     General: Bowel sounds are normal. There is no distension.     Palpations: Abdomen is soft.     Tenderness: There is no abdominal tenderness.  Musculoskeletal:        General: No tenderness. Normal range of motion.     Cervical back:  Normal range of motion and neck supple.  Skin:    General: Skin is warm and dry.     Findings: No erythema or rash.  Neurological:     Mental Status: He is alert and oriented to person, place, and time.     Cranial Nerves: No cranial nerve deficit.     Deep Tendon Reflexes: Reflexes are normal and symmetric.  Psychiatric:        Behavior: Behavior normal.        Thought Content: Thought content normal.        Judgment: Judgment normal.      BP 137/69   Pulse 65   Temp 98 F (36.7 C) (Temporal)   Ht 5\' 11"  (1.803 m)   Wt 230 lb 3.2 oz (104.4 kg)   BMI 32.11 kg/m      Assessment & Plan:  Bruce [name] comes in today with chief complaint of Hypertension   Diagnosis and orders addressed:  1. Primary hypertension  2. Current smoker  3. Obesity (BMI 30-39.9)    Labs pending Health Maintenance reviewed Diet and exercise encouraged  Follow up plan: 3 months   Bruce Dick, FNP

## 2021-05-01 NOTE — Patient Instructions (Signed)

## 2021-05-03 ENCOUNTER — Other Ambulatory Visit: Payer: Self-pay | Admitting: Family

## 2021-05-03 MED ORDER — POTASSIUM CHLORIDE CRYS ER 20 MEQ PO TBCR: 20.0000 meq | EXTENDED_RELEASE_TABLET | Freq: Every day | ORAL | 0 refills | Status: DC

## 2021-05-08 NOTE — Progress Notes (Signed)
Cardiology Clinic Note   Patient Name: Bruce [name] Date of Encounter: 05/09/2021  Primary Care Provider:  Junie Spencer, FNP Primary Cardiologist:  Nanetta Batty, MD  Patient Profile    Bruce [name] presents the clinic today for follow-up evaluation of his essential hypertension and abnormal EKG.  Past Medical History    Past Medical History:  Diagnosis Date   Clotting disorder (HCC)    Difficulty clotting blood after tooth extractions or lacerations.   Heart murmur    Hypertension    Meningitis    Had at age 86 years old.   VSD (ventricular septal defect), supracristal    Past Surgical History:  Procedure Laterality Date   KNEE ARTHROSCOPY WITH MEDIAL MENISECTOMY Left 12/31/2017   Procedure: LEFT KNEE ARTHROSCOPY WITH MEDIAL MENISECTOMY;  Surgeon: Beverely Low, MD;  Location[facility] OR;  Service: Orthopedics;  Laterality: Left;    Allergies  No Known Allergies  History of Present Illness    Bruce [name] has a PMH of essential hypertension, VSD, and abnormal EKG during stress test.  He was seen by Dr. Allyson Sabal on 05/20/2018.  During that time he was referred from his PCP.  He was noted to have abnormal GXT.  His risk factors included 80 pack years of tobacco abuse and HTN.  He denied family history of heart disease.  He also denied ever having a CVA or MI.  He denied shortness of breath.  He continued to be asymptomatic with his VSD and had previously been followed by a cardiologist in [place].  Due to his lack of symptoms no further ischemic evaluation was pursued.  He presents to the clinic today for follow-up evaluation states he has been experiencing some increased chest pressure.  He presented to his PCP who prescribed him hydrochlorothiazide and losartan.  His chest discomfort has gotten better.  She also did an EKG which sinus bradycardia 56 bpm.  He reports that his wife is sick which has increased his stress level.  He reports that she may only have  around 6-8 months left.  He reports that he eats a heart healthy low-sodium diet and maintains his physical activity.  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  We discussed his ventral septal defect.  He was also prescribed rosuvastatin by his PCP which she is not currently taking because he felt his cholesterol was fairly well controlled.  I will order an echocardiogram, have him continue his low-salt diet, give him mindfulness stress reduction sheet, have him maintain his physical activity and return for follow-up as scheduled with Dr. Allyson Sabal.  Today he denies  shortness of breath, lower extremity edema, fatigue, palpitations, melena, hematuria, hemoptysis, diaphoresis, weakness, presyncope, syncope, orthopnea, and PND.  Home Medications    Prior to Admission medications   Medication Sig Start Date End Date Taking? Authorizing Provider  aspirin EC 81 MG tablet Take 81 mg by mouth daily. Swallow whole.    [provider]  hydrochlorothiazide (HYDRODIURIL) 25 MG tablet Take 1 tablet (25 mg total) by mouth daily. 04/16/21   Junie Spencer, FNP  losartan (COZAAR) 100 MG tablet Take 1 tablet (100 mg total) by mouth daily. 04/10/21   [name] A, FNP  potassium chloride SA (KLOR-CON) 20 MEQ tablet Take 1 tablet (20 mEq total) by mouth daily. 05/03/21   Junie Spencer, FNP  rosuvastatin (CRESTOR) 10 MG tablet Take 1 tablet (10 mg total) by mouth daily. 04/19/21  Junie Spencer, FNP    Family History    Family History  Problem Relation Age of Onset   Arthritis Mother    Hypertension Mother    Hyperlipidemia Mother    Cancer Mother        Brain tumor.   Diabetes Mother    Cancer Father    Cancer Brother        Bone   He indicated that his mother is deceased. He indicated that his father is deceased. He indicated that his brother is deceased.  Social History    Social History   Socioeconomic History   Marital status: Married    Spouse  name: Not on file   Number of children: Not on file   Years of education: Not on file   Highest education level: Not on file  Occupational History   Not on file  Tobacco Use   Smoking status: Every Day    Packs/day: 2.00    Types: Cigarettes   Smokeless tobacco: Never  Vaping Use   Vaping Use: Never used  Substance and Sexual Activity   Alcohol use: No   Drug use: No   Sexual activity: Yes  Other Topics Concern   Not on file  Social History Narrative   Not on file   Social Determinants of Health   Financial Resource Strain: Not on file  Food Insecurity: Not on file  Transportation Needs: Not on file  Physical Activity: Not on file  Stress: Not on file  Social Connections: Not on file  Intimate Partner Violence: Not on file     Review of Systems    General:  No chills, fever, night sweats or weight changes.  Cardiovascular:  No chest pain, dyspnea on exertion, edema, orthopnea, palpitations, paroxysmal nocturnal dyspnea. Dermatological: No rash, lesions/masses Respiratory: No cough, dyspnea Urologic: No hematuria, dysuria Abdominal:   No nausea, vomiting, diarrhea, bright red blood per rectum, melena, or hematemesis Neurologic:  No visual changes, wkns, changes in mental status. All other systems reviewed and are otherwise negative except as noted above.  Physical Exam    VS:  BP 130/70 (BP Location: Left Arm)   Pulse (!) 58   Ht 5\' 11"  (1.803 m)   Wt 230 lb 3.2 oz (104.4 kg)   SpO2 96%   BMI 32.11 kg/m  , BMI Body mass index is 32.11 kg/m. GEN: Well nourished, well developed, in no acute distress. HEENT: normal. Neck: Supple, no JVD, carotid bruits, or masses. Cardiac: RRR, no murmurs, rubs, or gallops. No clubbing, cyanosis, edema.  Radials/DP/PT 2+ and equal bilaterally.  Respiratory:  Respirations regular and unlabored, clear to auscultation bilaterally. GI: Soft, nontender, nondistended, BS + x 4. MS: no deformity or atrophy. Skin: warm and dry, no  rash. Neuro:  Strength and sensation are intact. Psych: Normal affect.  Accessory Clinical Findings    Recent Labs: 04/16/2021: ALT 11; Hemoglobin 15.2; Platelets 200; TSH 2.050 05/01/2021: BUN 15; Creatinine, Ser 1.06; Potassium 3.3; Sodium 140   Recent Lipid Panel    Component Value Date/Time   CHOL 151 04/16/2021 1304   TRIG 195 (H) 04/16/2021 1304   HDL 44 04/16/2021 1304   CHOLHDL 3.4 04/16/2021 1304   LDLCALC 74 04/16/2021 1304    ECG personally reviewed by me today-none today.  Exercise tolerance test 05/07/2018  Blood pressure demonstrated a normal response to exercise. Horizontal ST segment depression ST segment depression of 0.5 mm was noted during stress in the V5, V6, II and  aVF leads, beginning at 2 minutes of stress, ending at 11 minutes of stress, and returning to baseline after 5-9 minutes of recovery. No T wave inversion was noted during stress.   Patient had PVCs prior to exercise, they did reduce some with more strenuous exercise and then returned in recovery.  Patient also had less than 1 mm ST depression in leads II, aVF, V5 to v6 that was noted 2 minutes into the exercise part and lasted through till 5 minutes into recovery.  Patient was asymptomatic throughout.  This is not a conclusive result for underlying CAD but is more inconclusive.  We will will recommend that he be referred to cardiology for further evaluation. Arville Care, MD [facility] 05/14/2018, 7:51 AM  Assessment & Plan   1.  Abnormal exercise stress test-reports increased chest pressure.  Has had less chest pressure with reduction in his blood pressure.  It is now well controlled in the 130s over 70s at home.  GXT previously reviewed and no further ischemic evaluation ordered. Heart healthy low-sodium diet Increase physical activity as tolerated Order echocardiogram  Essential hypertension-BP today 130/70.  Well-controlled at home. Continue losartan, HCTZ Heart  healthy low-sodium diet-salty 6 given Increase physical activity as tolerated  Ventral septal defect-continues to be asymptomatic.  Previously evaluated by echocardiogram per cardiologist in [place].  Disposition: Follow-up with Dr. Allyson Sabal as scheduled.  Thomasene Ripple. Lauralei Clouse NP-C    05/09/2021, 3:44 PM [facility] [address] Office [phone] Fax [phone]  Notice: This dictation was prepared with Dragon dictation along with smaller phrase technology. Any transcriptional errors that result from this process are unintentional and may not be corrected upon review.  I spent 14 minutes examining this patient, reviewing medications, and using patient centered shared decision making involving her cardiac care.  Prior to her visit I spent greater than 20 minutes reviewing her past medical history,  medications, and prior cardiac tests.

## 2021-05-09 ENCOUNTER — Other Ambulatory Visit: Payer: Self-pay

## 2021-05-09 ENCOUNTER — Ambulatory Visit: Payer: BC Managed Care – PPO | Admitting: General Practice

## 2021-05-09 ENCOUNTER — Encounter: Payer: Self-pay | Admitting: General Practice

## 2021-05-09 VITALS — BP 130/70 | HR 58 | Ht 71.0 in | Wt 230.2 lb

## 2021-05-09 DIAGNOSIS — R9431 Abnormal electrocardiogram [ECG] [EKG]: Secondary | ICD-10-CM

## 2021-05-09 DIAGNOSIS — Q210 Ventricular septal defect: Secondary | ICD-10-CM | POA: Diagnosis not present

## 2021-05-09 DIAGNOSIS — I10 Essential (primary) hypertension: Secondary | ICD-10-CM | POA: Diagnosis not present

## 2021-05-09 NOTE — Patient Instructions (Signed)
Medication Instructions:  The current medical regimen is effective;  continue present plan and medications as directed. Please refer to the Current Medication list given to you today.  *If you need a refill on your cardiac medications before your next appointment, please call your pharmacy*  Lab Work: NONE  Testing/Procedures: Echocardiogram - Your physician has requested that you have an echocardiogram. Echocardiography is a painless test that uses sound waves to create images of your heart. It provides your doctor with information about the size and shape of your heart and how well your heart's chambers and valves are working. This procedure takes approximately one hour. There are no restrictions for this procedure. This will be performed at our [facility] location - [address], [address].  Special Instructions TAKE AND LOG YOUR BLOOD PRESSURE  PLEASE READ AND FOLLOW SALTY 6-ATTACHED-1,800 mg daily  PLEASE INCREASE PHYSICAL ACTIVITY AS TOLERATED  PLEASE READ AND FOLLOW STRESS REDUCTION TIPS-ATTACHED  Follow-Up: Your next appointment:  KEEP SCHEDULED APPOINTMENT In Person with Nanetta Batty, MD   At [facility], you and your health needs are our priority.  As part of our continuing mission to provide you with exceptional heart care, we have created designated Provider Care Teams.  These Care Teams include your primary Cardiologist (physician) and Advanced Practice Providers (APPs -  Physician Assistants and Nurse Practitioners) who all work together to provide you with the care you need, when you need it.         Mindfulness-Based Stress Reduction Mindfulness-based stress reduction (MBSR) is a program that helps people learn to practice mindfulness. Mindfulness is the practice of intentionally paying attention to the present moment. MBSR focuses on developing self-awareness, which allows you to respond to life stress without judgment or negative emotions. It can be learned  and practiced through techniques such as education, breathing exercises, meditation, and yoga. MBSR includes several mindfulness techniques in one program. MBSR works best when you understand the treatment, are willing to try new things, and can commit to spending time practicing what you learn. MBSR training may include learning about: How your emotions, thoughts, and reactions affect your body. New ways to respond to things that cause negative thoughts to start (triggers). How to notice your thoughts and let go of them. Practicing awareness of everyday things that you normally do without thinking. The techniques and goals of different types of meditation. What are the benefits of MBSR? MBSR can have many benefits, which include helping you to: Develop self-awareness. This refers to knowing and understanding yourself. Learn skills and attitudes that help you to participate in your own health care. Learn new ways to care for yourself. Be more accepting about how things are, and let things go. Be less judgmental and approach things with an open mind. Be patient with yourself and trust yourself more. MBSR has also been shown to: Reduce negative emotions, such as depression and anxiety. Improve memory and focus. Change how you sense and approach pain. Boost your body's ability to fight infections. Help you connect better with other people. Improve your sense of well-being. Follow these instructions at home:  Find a local in-person or online MBSR program. Set aside some time regularly for mindfulness practice. Find a mindfulness practice that works best for you. This may include one or more of the following: Meditation. Meditation involves focusing your mind on a certain thought or activity. Breathing awareness exercises. These help you to stay present by focusing on your breath. Body scan. For  this practice, you lie down and pay attention to each part of your body from head to toe. You can  identify tension and soreness and intentionally relax parts of your body. Yoga. Yoga involves stretching and breathing, and it can improve your ability to move and be flexible. It can also provide an experience of testing your body's limits, which can help you release stress. Mindful eating. This way of eating involves focusing on the taste, texture, color, and smell of each bite of food. Because this slows down eating and helps you feel full sooner, it can be an important part of a weight-loss plan. Find a podcast or recording that provides guidance for breathing awareness, body scan, or meditation exercises. You can listen to these any time when you have a free moment to rest without distractions. Follow your treatment plan as told by your health care provider. This may include taking regular medicines and making changes to your diet or lifestyle as recommended. How to practice mindfulness To do a basic awareness exercise: Find a comfortable place to sit. Pay attention to the present moment. Observe your thoughts, feelings, and surroundings just as they are. Avoid placing judgment on yourself, your feelings, or your surroundings. Make note of any judgment that comes up, and let it go. Your mind may wander, and that is okay. Make note of when your thoughts drift, and return your attention to the present moment. To do basic mindfulness meditation: Find a comfortable place to sit. This may include a stable chair or a firm floor cushion. Sit upright with your back straight. Let your arms fall next to your side with your hands resting on your legs. If sitting in a chair, rest your feet flat on the floor. If sitting on a cushion, cross your legs in front of you. Keep your head in a neutral position with your chin dropped slightly. Relax your jaw and rest the tip of your tongue on the roof of your mouth. Drop your gaze to the floor. You can close your eyes if you like. Breathe normally and pay attention  to your breath. Feel the air moving in and out of your nose. Feel your belly expanding and relaxing with each breath. Your mind may wander, and that is okay. Make note of when your thoughts drift, and return your attention to your breath. Avoid placing judgment on yourself, your feelings, or your surroundings. Make note of any judgment or feelings that come up, let them go, and bring your attention back to your breath. When you are ready, lift your gaze or open your eyes. Pay attention to how your body feels after the meditation. Where to find more information You can find more information about MBSR from: Your health care provider. Community-based meditation centers or programs. Programs offered near you. Summary Mindfulness-based stress reduction (MBSR) is a program that teaches you how to intentionally pay attention to the present moment. It is used with other treatments to help you cope better with daily stress, emotions, and pain. MBSR focuses on developing self-awareness, which allows you to respond to life stress without judgment or negative emotions. MBSR programs may involve learning different mindfulness practices, such as breathing exercises, meditation, yoga, body scan, or mindful eating. Find a mindfulness practice that works best for you, and set aside time for it on a regular basis. This information is not intended to replace advice given to you by your health care provider. Make sure you discuss any questions you have with your  health care provider. Document Revised: 10/27/2020 Document Reviewed: 05/05/2020 Elsevier Patient Education  2022 [employer].

## 2021-05-30 ENCOUNTER — Ambulatory Visit (HOSPITAL_COMMUNITY): Payer: BC Managed Care – PPO | Attending: Cardiology

## 2021-05-30 ENCOUNTER — Other Ambulatory Visit: Payer: Self-pay

## 2021-05-30 DIAGNOSIS — Q210 Ventricular septal defect: Secondary | ICD-10-CM | POA: Diagnosis not present

## 2021-05-30 LAB — ECHOCARDIOGRAM COMPLETE
Area-P 1/2: 2.99 cm2
S' Lateral: 3.9 cm

## 2021-05-31 ENCOUNTER — Other Ambulatory Visit: Payer: Self-pay

## 2021-05-31 DIAGNOSIS — Q210 Ventricular septal defect: Secondary | ICD-10-CM

## 2021-06-19 ENCOUNTER — Ambulatory Visit: Payer: Self-pay | Admitting: Cardiovascular Disease

## 2021-11-01 ENCOUNTER — Ambulatory Visit (INDEPENDENT_AMBULATORY_CARE_PROVIDER_SITE_OTHER): Payer: BC Managed Care – PPO | Admitting: Family

## 2021-11-01 ENCOUNTER — Encounter: Payer: Self-pay | Admitting: Family

## 2021-11-01 VITALS — BP 158/80 | HR 60 | Temp 97.2°F | Ht 71.0 in | Wt 229.4 lb

## 2021-11-01 DIAGNOSIS — I10 Essential (primary) hypertension: Secondary | ICD-10-CM | POA: Diagnosis not present

## 2021-11-01 DIAGNOSIS — F17200 Nicotine dependence, unspecified, uncomplicated: Secondary | ICD-10-CM | POA: Diagnosis not present

## 2021-11-01 DIAGNOSIS — E669 Obesity, unspecified: Secondary | ICD-10-CM | POA: Diagnosis not present

## 2021-11-01 DIAGNOSIS — E559 Vitamin D deficiency, unspecified: Secondary | ICD-10-CM | POA: Diagnosis not present

## 2021-11-01 DIAGNOSIS — E785 Hyperlipidemia, unspecified: Secondary | ICD-10-CM

## 2021-11-01 MED ORDER — LOSARTAN POTASSIUM 100 MG PO TABS: 100.0000 mg | ORAL_TABLET | Freq: Every day | ORAL | 1 refills | Status: DC

## 2021-11-01 MED ORDER — HYDROCHLOROTHIAZIDE 25 MG PO TABS: 25.0000 mg | ORAL_TABLET | Freq: Every day | ORAL | 3 refills | Status: DC

## 2021-11-01 NOTE — Patient Instructions (Signed)

## 2021-11-01 NOTE — Progress Notes (Signed)
? ?Subjective:  ? ? Patient ID: Bruce [name], male    DOB: 02-02-1961, 61 y.o.   MRN: [id number] ? ?Chief Complaint  ?Patient presents with  ? Medical Management of Chronic Issues  ?  Lost wife 4 weeks.  ? ?Pt presents to the office today for chronic follow up.  He lost his wife off 53 years last month. He is dealing with this.  ? ?His BP is elevated today, but has not been taking his losartan. States he has not taken this in months.  ? ?He is followed by Cardiologist.  ?Hypertension ?This is a chronic problem. The current episode started more than 1 year ago. The problem has been waxing and waning since onset. The problem is uncontrolled. Associated symptoms include malaise/fatigue. Pertinent negatives include no peripheral edema or shortness of breath. Risk factors for coronary artery disease include dyslipidemia, obesity, male gender and smoking/tobacco exposure. The current treatment provides moderate improvement.  ?Nicotine Dependence ?Presents for follow-up visit. His urge triggers include company of smokers. The symptoms have been stable. He smokes 2 packs of cigarettes per day.  ?Hyperlipidemia ?This is a chronic problem. The current episode started more than 1 year ago. The problem is controlled. Exacerbating diseases include obesity. Factors aggravating his hyperlipidemia include smoking. Pertinent negatives include no shortness of breath. Current antihyperlipidemic treatment includes statins. The current treatment provides moderate improvement of lipids.  ? ? ? ?Review of Systems  ?Constitutional:  Positive for malaise/fatigue.  ?Respiratory:  Negative for shortness of breath.   ?All other systems reviewed and are negative. ? ?   ?Objective:  ? Physical Exam ?Vitals reviewed.  ?Constitutional:   ?   General: He is not in acute distress. ?   Appearance: He is well-developed. He is obese.  ?HENT:  ?   Head: Normocephalic.  ?   Right Ear: Tympanic membrane normal.  ?   Left Ear: Tympanic membrane normal.   ?Eyes:  ?   General:     ?   Right eye: No discharge.     ?   Left eye: No discharge.  ?   Pupils: Pupils are equal, round, and reactive to light.  ?Neck:  ?   Thyroid: No thyromegaly.  ?Cardiovascular:  ?   Rate and Rhythm: Normal rate and regular rhythm.  ?   Heart sounds: Normal heart sounds. No murmur heard. ?Pulmonary:  ?   Effort: Pulmonary effort is normal. No respiratory distress.  ?   Breath sounds: Normal breath sounds. No wheezing.  ?Abdominal:  ?   General: Bowel sounds are normal. There is no distension.  ?   Palpations: Abdomen is soft.  ?   Tenderness: There is no abdominal tenderness.  ?Musculoskeletal:     ?   General: No tenderness. Normal range of motion.  ?   Cervical back: Normal range of motion and neck supple.  ?Skin: ?   General: Skin is warm and dry.  ?   Findings: No erythema or rash.  ?Neurological:  ?   Mental Status: He is alert and oriented to person, place, and time.  ?   Cranial Nerves: No cranial nerve deficit.  ?   Deep Tendon Reflexes: Reflexes are normal and symmetric.  ?Psychiatric:     ?   Behavior: Behavior normal.     ?   Thought Content: Thought content normal.     ?   Judgment: Judgment normal.  ? ? ? ? ?BP (!) 166/86   Pulse 60  Temp (!) 97.2 ?F (36.2 ?C) (Temporal)   Ht 5' 11" (1.803 m)   Wt 229 lb 6.4 oz (104.1 kg)   BMI 31.99 kg/m?  ? ?   ?Assessment & Plan:  ?Bruce [name] comes in today with chief complaint of Medical Management of Chronic Issues (Lost wife 4 weeks.) ? ? ?Diagnosis and orders addressed: ? ?1. Primary hypertension ?Will restart losartan  ?-Daily blood pressure log given with instructions on how to fill out and told to bring to next visit ?-Dash diet information given ?-Exercise encouraged ?- Stress Management  ?-Continue current meds ?-RTO in 1 month ?- hydrochlorothiazide (HYDRODIURIL) 25 MG tablet; Take 1 tablet (25 mg total) by mouth daily.  Dispense: 90 tablet; Refill: 3 ?- CMP14+EGFR ?- CBC with Differential/Platelet ?- losartan (COZAAR)  100 MG tablet; Take 1 tablet (100 mg total) by mouth daily.  Dispense: 90 tablet; Refill: 1 ? ?2. Smoker ?Smoking cessation discussed  ?- CMP14+EGFR ?- CBC with Differential/Platelet ? ?3. Obesity (BMI 30-39.9) ?- CMP14+EGFR ?- CBC with Differential/Platelet ? ?4. Current smoker ?- CMP14+EGFR ?- CBC with Differential/Platelet ? ?5. Vitamin D deficiency ?- CMP14+EGFR ?- CBC with Differential/Platelet ? ?6. Hyperlipidemia, unspecified hyperlipidemia type ?- CMP14+EGFR ?- CBC with Differential/Platelet ? ? ?Labs pending ?Health Maintenance reviewed ?Diet and exercise encouraged ? ?Follow up plan: ?1 month to recheck HTN ? ? ?.Evelina Dun, FNP ? ? ? ?

## 2021-11-02 LAB — CMP14+EGFR
ALT: 10 IU/L (ref 0–44)
AST: 15 IU/L (ref 0–40)
Albumin/Globulin Ratio: 2.1 (ref 1.2–2.2)
Albumin: 4.4 g/dL (ref 3.8–4.9)
Alkaline Phosphatase: 109 IU/L (ref 44–121)
BUN/Creatinine Ratio: 9 (ref 9–20)
BUN: 9 mg/dL (ref 6–24)
Bilirubin Total: 0.4 mg/dL (ref 0.0–1.2)
CO2: 25 mmol/L (ref 20–29)
Calcium: 9.2 mg/dL (ref 8.7–10.2)
Chloride: 103 mmol/L (ref 96–106)
Creatinine, Ser: 0.97 mg/dL (ref 0.76–1.27)
Globulin, Total: 2.1 g/dL (ref 1.5–4.5)
Glucose: 96 mg/dL (ref 70–99)
Potassium: 4.4 mmol/L (ref 3.5–5.2)
Sodium: 140 mmol/L (ref 134–144)
Total Protein: 6.5 g/dL (ref 6.0–8.5)
eGFR: 90 mL/min/{1.73_m2} (ref >59)

## 2021-11-02 LAB — CBC WITH DIFFERENTIAL/PLATELET
Basophils Absolute: 0.1 10*3/uL (ref 0.0–0.2)
Basos: 1 %
EOS (ABSOLUTE): 0.1 10*3/uL (ref 0.0–0.4)
Eos: 1 %
Hematocrit: 49.3 % (ref 37.5–51.0)
Hemoglobin: 16.2 g/dL (ref 13.0–17.7)
Immature Grans (Abs): 0 10*3/uL (ref 0.0–0.1)
Immature Granulocytes: 0 %
Lymphocytes Absolute: 1.8 10*3/uL (ref 0.7–3.1)
Lymphs: 25 %
MCH: 27.8 pg (ref 26.6–33.0)
MCHC: 32.9 g/dL (ref 31.5–35.7)
MCV: 85 fL (ref 79–97)
Monocytes Absolute: 0.5 10*3/uL (ref 0.1–0.9)
Monocytes: 7 %
Neutrophils Absolute: 4.7 10*3/uL (ref 1.4–7.0)
Neutrophils: 66 %
Platelets: 213 10*3/uL (ref 150–450)
RBC: 5.82 x10E6/uL — ABNORMAL HIGH (ref 4.14–5.80)
RDW: 13.1 % (ref 11.6–15.4)
WBC: 7.1 10*3/uL (ref 3.4–10.8)

## 2021-12-03 ENCOUNTER — Encounter: Payer: Self-pay | Admitting: Family

## 2021-12-03 ENCOUNTER — Ambulatory Visit (INDEPENDENT_AMBULATORY_CARE_PROVIDER_SITE_OTHER): Payer: BC Managed Care – PPO | Admitting: Family

## 2021-12-03 VITALS — BP 119/60 | HR 77 | Temp 98.0°F | Ht 71.0 in | Wt 228.0 lb

## 2021-12-03 DIAGNOSIS — F17200 Nicotine dependence, unspecified, uncomplicated: Secondary | ICD-10-CM | POA: Diagnosis not present

## 2021-12-03 DIAGNOSIS — E669 Obesity, unspecified: Secondary | ICD-10-CM

## 2021-12-03 DIAGNOSIS — I10 Essential (primary) hypertension: Secondary | ICD-10-CM

## 2021-12-03 DIAGNOSIS — E785 Hyperlipidemia, unspecified: Secondary | ICD-10-CM

## 2021-12-03 LAB — BMP8+EGFR
BUN/Creatinine Ratio: 12 (ref 10–24)
BUN: 14 mg/dL (ref 8–27)
CO2: 27 mmol/L (ref 20–29)
Calcium: 9.4 mg/dL (ref 8.6–10.2)
Chloride: 99 mmol/L (ref 96–106)
Creatinine, Ser: 1.17 mg/dL (ref 0.76–1.27)
Glucose: 173 mg/dL — ABNORMAL HIGH (ref 70–99)
Potassium: 3.8 mmol/L (ref 3.5–5.2)
Sodium: 140 mmol/L (ref 134–144)
eGFR: 71 mL/min/{1.73_m2} (ref >59)

## 2021-12-03 NOTE — Patient Instructions (Signed)
Earwax Buildup, Adult ?The ears produce a substance called earwax that helps keep bacteria out of the ear and protects the skin in the ear canal. Occasionally, earwax can build up in the ear and cause discomfort or hearing loss. ?What are the causes? ?This condition is caused by a buildup of earwax. Ear canals are self-cleaning. Ear wax is made in the outer part of the ear canal and generally falls out in small amounts over time. ?When the self-cleaning mechanism is not working, earwax builds up and can cause decreased hearing and discomfort. Attempting to clean ears with cotton swabs can push the earwax deep into the ear canal and cause decreased hearing and pain. ?What increases the risk? ?This condition is more likely to develop in people who: ?Clean their ears often with cotton swabs. ?Pick at their ears. ?Use earplugs or in-ear headphones often, or wear hearing aids. ?The following factors may also make you more likely to develop this condition: ?Being male. ?Being of older age. ?Naturally producing more earwax. ?Having narrow ear canals. ?Having earwax that is overly thick or sticky. ?Having excess hair in the ear canal. ?Having eczema. ?Being dehydrated. ?What are the signs or symptoms? ?Symptoms of this condition include: ?Reduced or muffled hearing. ?A feeling of fullness in the ear or feeling that the ear is plugged. ?Fluid coming from the ear. ?Ear pain or an itchy ear. ?Ringing in the ear. ?Coughing. ?Balance problems. ?An obvious piece of earwax that can be seen inside the ear canal. ?How is this diagnosed? ?This condition may be diagnosed based on: ?Your symptoms. ?Your medical history. ?An ear exam. During the exam, your health care provider will look into your ear with an instrument called an otoscope. ?You may have tests, including a hearing test. ?How is this treated? ?This condition may be treated by: ?Using ear drops to soften the earwax. ?Having the earwax removed by a health care provider. The  health care provider may: ?Flush the ear with water. ?Use an instrument that has a loop on the end (curette). ?Use a suction device. ?Having surgery to remove the wax buildup. This may be done in severe cases. ?Follow these instructions at home: ? ?Take over-the-counter and prescription medicines only as told by your health care provider. ?Do not put any objects, including cotton swabs, into your ear. You can clean the opening of your ear canal with a washcloth or facial tissue. ?Follow instructions from your health care provider about cleaning your ears. Do not overclean your ears. ?Drink enough fluid to keep your urine pale yellow. This will help to thin the earwax. ?Keep all follow-up visits as told. If earwax builds up in your ears often or if you use hearing aids, consider seeing your health care provider for routine, preventive ear cleanings. Ask your health care provider how often you should schedule your cleanings. ?If you have hearing aids, clean them according to instructions from the manufacturer and your health care provider. ?Contact a health care provider if: ?You have ear pain. ?You develop a fever. ?You have pus or other fluid coming from your ear. ?You have hearing loss. ?You have ringing in your ears that does not go away. ?You feel like the room is spinning (vertigo). ?Your symptoms do not improve with treatment. ?Get help right away if: ?You have bleeding from the affected ear. ?You have severe ear pain. ?Summary ?Earwax can build up in the ear and cause discomfort or hearing loss. ?The most common symptoms of this condition include   reduced or muffled hearing, a feeling of fullness in the ear, or feeling that the ear is plugged. ?This condition may be diagnosed based on your symptoms, your medical history, and an ear exam. ?This condition may be treated by using ear drops to soften the earwax or by having the earwax removed by a health care provider. ?Do not put any objects, including cotton  swabs, into your ear. You can clean the opening of your ear canal with a washcloth or facial tissue. ?This information is not intended to replace advice given to you by your health care provider. Make sure you discuss any questions you have with your health care provider. ?Document Revised: 12/07/2019 Document Reviewed: 12/07/2019 ?Elsevier Patient Education ? 2022 Elsevier Inc. ? ?

## 2021-12-03 NOTE — Progress Notes (Signed)
? ?Subjective:  ? ? Patient ID: Bruce [name], male    DOB: 25-Apr-1961, 61 y.o.   MRN: [id number] ? ?Chief Complaint  ?Patient presents with  ? Medical Management of Chronic Issues  ? ?Pt presents to the office today for follow up on HTN. He was seen last month and his BP was elevated. However, he was dealing with stress of losing his wife of 35 years and had not taken his HCTZ. He restarted it and his BP is at goal today.  ?Hypertension ?This is a chronic problem. The current episode started more than 1 year ago. The problem has been resolved since onset. The problem is controlled. Pertinent negatives include no malaise/fatigue, peripheral edema or shortness of breath. Risk factors for coronary artery disease include dyslipidemia, obesity and male gender. The current treatment provides moderate improvement.  ?Nicotine Dependence ?Presents for follow-up visit. His urge triggers include company of smokers. The symptoms have been stable. He smokes 1 pack of cigarettes per day.  ?Hyperlipidemia ?This is a chronic problem. The current episode started more than 1 year ago. The problem is controlled. Recent lipid tests were reviewed and are normal. Pertinent negatives include no shortness of breath. He is currently on no antihyperlipidemic treatment. The current treatment provides no improvement of lipids. Risk factors for coronary artery disease include dyslipidemia, hypertension and male sex.  ? ? ? ?Review of Systems  ?Constitutional:  Negative for malaise/fatigue.  ?Respiratory:  Negative for shortness of breath.   ?All other systems reviewed and are negative. ? ?   ?Objective:  ? Physical Exam ?Vitals reviewed.  ?Constitutional:   ?   General: He is not in acute distress. ?   Appearance: He is well-developed.  ?HENT:  ?   Head: Normocephalic.  ?   Right Ear: Tympanic membrane normal. There is impacted cerumen.  ?   Left Ear: Tympanic membrane normal.  ?Eyes:  ?   General:     ?   Right eye: No discharge.     ?    Left eye: No discharge.  ?   Pupils: Pupils are equal, round, and reactive to light.  ?Neck:  ?   Thyroid: No thyromegaly.  ?Cardiovascular:  ?   Rate and Rhythm: Normal rate and regular rhythm.  ?   Heart sounds: Murmur heard.  ?Pulmonary:  ?   Effort: Pulmonary effort is normal. No respiratory distress.  ?   Breath sounds: Normal breath sounds. No wheezing.  ?Abdominal:  ?   General: Bowel sounds are normal. There is no distension.  ?   Palpations: Abdomen is soft.  ?   Tenderness: There is no abdominal tenderness.  ?Musculoskeletal:     ?   General: No tenderness. Normal range of motion.  ?   Cervical back: Normal range of motion and neck supple.  ?Skin: ?   General: Skin is warm and dry.  ?   Findings: No erythema or rash.  ?Neurological:  ?   Mental Status: He is alert and oriented to person, place, and time.  ?   Cranial Nerves: No cranial nerve deficit.  ?   Deep Tendon Reflexes: Reflexes are normal and symmetric.  ?Psychiatric:     ?   Behavior: Behavior normal.     ?   Thought Content: Thought content normal.     ?   Judgment: Judgment normal.  ? ? ? ? ?BP 119/60   Pulse 77   Temp 98 ?F (36.7 ?C) (Temporal)  Ht _0  (1.803 m)   Wt 228 lb (103.4 kg)   BMI 31.80 kg/m?  ? ?   ?Assessment & Plan:  ?Bruce [name] comes in today with chief complaint of Medical Management of Chronic Issues ? ? ?Diagnosis and orders addressed: ? ?1. Primary hypertension ?- BMP8+EGFR ? ?2. Current smoker ?- BMP8+EGFR ? ?3. Obesity (BMI 30-39.9) ?- BMP8+EGFR ? ?4. Hyperlipidemia, unspecified hyperlipidemia type ?- BMP8+EGFR ? ? ?Labs pending ?Health Maintenance reviewed ?Diet and exercise encouraged ? ?Follow up plan: ?6 months  ? ? ?Evelina Dun, FNP ? ? ? ?

## 2022-02-04 ENCOUNTER — Encounter (HOSPITAL_COMMUNITY): Payer: Self-pay | Admitting: General Practice

## 2022-02-18 ENCOUNTER — Telehealth (HOSPITAL_COMMUNITY): Payer: Self-pay | Admitting: General Practice

## 2022-02-18 NOTE — Telephone Encounter (Signed)
Just an FYI. We have made several attempts to contact this patient including sending a letter to schedule or reschedule their echocardiogram. We will be removing the patient from the echo WQ.  MAILED LETTER [facility] 02/04/22  02/04/22 [facility] to schedule @ 2:28/[facility]  01/29/22 [facility] to schedule X 2 @ 11:28/[facility]  01/25/22@958  [facility] to CB and schedule echo EVD       Thank you

## 2022-04-08 ENCOUNTER — Encounter: Payer: Self-pay | Admitting: Nurse Practitioner

## 2022-04-08 ENCOUNTER — Ambulatory Visit (INDEPENDENT_AMBULATORY_CARE_PROVIDER_SITE_OTHER): Payer: BC Managed Care – PPO | Admitting: Nurse Practitioner

## 2022-04-08 DIAGNOSIS — Z024 Encounter for examination for driving license: Secondary | ICD-10-CM

## 2022-04-08 DIAGNOSIS — Z0289 Encounter for other administrative examinations: Secondary | ICD-10-CM | POA: Diagnosis not present

## 2022-04-08 LAB — URINALYSIS
Bilirubin, UA: NEGATIVE
Glucose, UA: NEGATIVE
Leukocytes,UA: NEGATIVE
Nitrite, UA: NEGATIVE
Protein,UA: NEGATIVE
Specific Gravity, UA: 1.025 (ref 1.005–1.030)
Urobilinogen, Ur: 0.2 mg/dL (ref 0.2–1.0)
pH, UA: 5.5 (ref 5.0–7.5)

## 2022-04-08 NOTE — Progress Notes (Signed)
Private DOT- see scanned in document 

## 2022-05-08 ENCOUNTER — Encounter: Payer: Self-pay | Admitting: Family Medicine

## 2022-05-08 ENCOUNTER — Ambulatory Visit (INDEPENDENT_AMBULATORY_CARE_PROVIDER_SITE_OTHER): Payer: BC Managed Care – PPO | Admitting: Family Medicine

## 2022-05-08 VITALS — BP 132/61 | HR 88 | Temp 98.0°F | Ht 71.0 in | Wt 228.0 lb

## 2022-05-08 DIAGNOSIS — L739 Follicular disorder, unspecified: Secondary | ICD-10-CM | POA: Diagnosis not present

## 2022-05-08 MED ORDER — CEPHALEXIN 500 MG PO CAPS: 500.0000 mg | ORAL_CAPSULE | Freq: Four times a day (QID) | ORAL | 0 refills | Status: DC

## 2022-05-08 NOTE — Progress Notes (Signed)
BP 132/61   Pulse 88   Temp 98 F (36.7 C)   Ht 5\' 11"  (1.803 m)   Wt 228 lb (103.4 kg)   SpO2 95%   BMI 31.80 kg/m    Subjective:   Patient ID: , male    DOB: [birth year], 61 y.o.   MRN: [id number]  HPI: Bruce [name] is a 61 y.o. male presenting on 05/08/2022 for Foreign Body (LLQ- bleeding, not painful, no itchng)   HPI Skin lesion of the left lower abdomen, bleeding Patient has skin lesion on his left lower abdomen right at his belt line.  He says he noticed it just yesterday afternoon and it drained and had a good amount of bloody purulent material that was thick come out of it and then it drained again overnight and then again today this morning and he says that it is not draining currently.  He denies any pain or itching or chills.  He denies any fevers.  He denies any rash anywhere else.  He has been cleaning it with alcohol and he feels like it has gotten better because of that.  Relevant past medical, surgical, family and social history reviewed and updated as indicated. Interim medical history since our last visit reviewed. Allergies and medications reviewed and updated.  Review of Systems  Constitutional:  Negative for chills and fever.  Eyes:  Negative for visual disturbance.  Respiratory:  Negative for shortness of breath and wheezing.   Cardiovascular:  Negative for chest pain and leg swelling.  Musculoskeletal:  Negative for back pain and gait problem.  Skin:  Positive for color change and wound. Negative for rash.  All other systems reviewed and are negative.   Per HPI unless specifically indicated above   Allergies as of 05/08/2022   No Known Allergies      Medication List        Accurate as of May 08, 2022 10:41 AM. If you have any questions, ask your nurse or doctor.          STOP taking these medications    rosuvastatin 10 MG tablet Commonly known as: Crestor Stopped by: May 10, 2022, MD       TAKE these  medications    aspirin EC 81 MG tablet Take 81 mg by mouth daily. Swallow whole.   cephALEXin 500 MG capsule Commonly known as: KEFLEX Take 1 capsule (500 mg total) by mouth 4 (four) times daily. Started by: Nils Pyle, MD   hydrochlorothiazide 25 MG tablet Commonly known as: HYDRODIURIL Take 1 tablet (25 mg total) by mouth daily.         Objective:   BP 132/61   Pulse 88   Temp 98 F (36.7 C)   Ht 5\' 11"  (1.803 m)   Wt 228 lb (103.4 kg)   SpO2 95%   BMI 31.80 kg/m   Wt Readings from Last 3 Encounters:  05/08/22 228 lb (103.4 kg)  12/03/21 228 lb (103.4 kg)  11/01/21 229 lb 6.4 oz (104.1 kg)    Physical Exam Vitals and nursing note reviewed.  Constitutional:      General: He is not in acute distress.    Appearance: He is well-developed. He is not diaphoretic.  Eyes:     General: No scleral icterus.    Conjunctiva/sclera: Conjunctivae normal.  Neck:     Thyroid: No thyromegaly.  Musculoskeletal:        General: Normal range of motion.  Skin:  General: Skin is warm and dry.     Findings: Lesion (Small opening with slight erythema without any fluctuation or induration, concerning that he had a folliculitis or small abscess) present. No rash.  Neurological:     Mental Status: He is alert and oriented to person, place, and time.     Coordination: Coordination normal.  Psychiatric:        Behavior: Behavior normal.       Assessment & Plan:   Problem List Items Addressed This Visit   None Visit Diagnoses     Folliculitis    -  Primary   Relevant Medications   cephALEXin (KEFLEX) 500 MG capsule       Sounds like it was a folliculitis or infected hair follicle.  It looks like it is drained fine and no induration or fluctuation noted today but will do an antibiotic just to clear completely Follow up plan: No follow-ups on file.  Counseling provided for all of the vaccine components No orders of the defined types were placed in this  encounter.   Arville Care, MD [facility] 05/08/2022, 10:41 AM

## 2022-06-06 ENCOUNTER — Encounter: Payer: Self-pay | Admitting: Family

## 2022-06-06 ENCOUNTER — Ambulatory Visit: Payer: BC Managed Care – PPO | Admitting: Family

## 2022-06-06 VITALS — BP 129/74 | HR 60 | Temp 97.6°F | Ht 71.0 in | Wt 232.4 lb

## 2022-06-06 DIAGNOSIS — E669 Obesity, unspecified: Secondary | ICD-10-CM | POA: Diagnosis not present

## 2022-06-06 DIAGNOSIS — E559 Vitamin D deficiency, unspecified: Secondary | ICD-10-CM | POA: Diagnosis not present

## 2022-06-06 DIAGNOSIS — Z6832 Body mass index (BMI) 32.0-32.9, adult: Secondary | ICD-10-CM

## 2022-06-06 DIAGNOSIS — F17200 Nicotine dependence, unspecified, uncomplicated: Secondary | ICD-10-CM | POA: Diagnosis not present

## 2022-06-06 DIAGNOSIS — Z0001 Encounter for general adult medical examination with abnormal findings: Secondary | ICD-10-CM

## 2022-06-06 DIAGNOSIS — I10 Essential (primary) hypertension: Secondary | ICD-10-CM | POA: Diagnosis not present

## 2022-06-06 DIAGNOSIS — Z0000 Encounter for general adult medical examination without abnormal findings: Secondary | ICD-10-CM | POA: Diagnosis not present

## 2022-06-06 DIAGNOSIS — Z122 Encounter for screening for malignant neoplasm of respiratory organs: Secondary | ICD-10-CM

## 2022-06-06 MED ORDER — HYDROCHLOROTHIAZIDE 25 MG PO TABS: 25.0000 mg | ORAL_TABLET | Freq: Every day | ORAL | 3 refills | Status: DC

## 2022-06-06 NOTE — Patient Instructions (Signed)
Health Maintenance, Male Adopting a healthy lifestyle and getting preventive care are important in promoting health and wellness. Ask your health care provider about: The right schedule for you to have regular tests and exams. Things you can do on your own to prevent diseases and keep yourself healthy. What should I know about diet, weight, and exercise? Eat a healthy diet  Eat a diet that includes plenty of vegetables, fruits, low-fat dairy products, and lean protein. Do not eat a lot of foods that are high in solid fats, added sugars, or sodium. Maintain a healthy weight Body mass index (BMI) is a measurement that can be used to identify possible weight problems. It estimates body fat based on height and weight. Your health care provider can help determine your BMI and help you achieve or maintain a healthy weight. Get regular exercise Get regular exercise. This is one of the most important things you can do for your health. Most adults should: Exercise for at least 150 minutes each week. The exercise should increase your heart rate and make you sweat (moderate-intensity exercise). Do strengthening exercises at least twice a week. This is in addition to the moderate-intensity exercise. Spend less time sitting. Even light physical activity can be beneficial. Watch cholesterol and blood lipids Have your blood tested for lipids and cholesterol at 60 years of age, then have this test every 5 years. You may need to have your cholesterol levels checked more often if: Your lipid or cholesterol levels are high. You are older than 60 years of age. You are at high risk for heart disease. What should I know about cancer screening? Many types of cancers can be detected early and may often be prevented. Depending on your health history and family history, you may need to have cancer screening at various ages. This may include screening for: Colorectal cancer. Prostate cancer. Skin cancer. Lung  cancer. What should I know about heart disease, diabetes, and high blood pressure? Blood pressure and heart disease High blood pressure causes heart disease and increases the risk of stroke. This is more likely to develop in people who have high blood pressure readings or are overweight. Talk with your health care provider about your target blood pressure readings. Have your blood pressure checked: Every 3-5 years if you are 18-39 years of age. Every year if you are 40 years old or older. If you are between the ages of 65 and 75 and are a current or former smoker, ask your health care provider if you should have a one-time screening for abdominal aortic aneurysm (AAA). Diabetes Have regular diabetes screenings. This checks your fasting blood sugar level. Have the screening done: Once every three years after age 45 if you are at a normal weight and have a low risk for diabetes. More often and at a younger age if you are overweight or have a high risk for diabetes. What should I know about preventing infection? Hepatitis B If you have a higher risk for hepatitis B, you should be screened for this virus. Talk with your health care provider to find out if you are at risk for hepatitis B infection. Hepatitis C Blood testing is recommended for: Everyone born from 1945 through 1965. Anyone with known risk factors for hepatitis C. Sexually transmitted infections (STIs) You should be screened each year for STIs, including gonorrhea and chlamydia, if: You are sexually active and are younger than 60 years of age. You are older than 60 years of age and your   health care provider tells you that you are at risk for this type of infection. Your sexual activity has changed since you were last screened, and you are at increased risk for chlamydia or gonorrhea. Ask your health care provider if you are at risk. Ask your health care provider about whether you are at high risk for HIV. Your health care provider  may recommend a prescription medicine to help prevent HIV infection. If you choose to take medicine to prevent HIV, you should first get tested for HIV. You should then be tested every 3 months for as long as you are taking the medicine. Follow these instructions at home: Alcohol use Do not drink alcohol if your health care provider tells you not to drink. If you drink alcohol: Limit how much you have to 0-2 drinks a day. Know how much alcohol is in your drink. In the [place] one drink equals one 12 oz bottle of beer (355 mL), one 5 oz glass of wine (148 mL), or one 1 oz glass of hard liquor (44 mL). Lifestyle Do not use any products that contain nicotine or tobacco. These products include cigarettes, chewing tobacco, and vaping devices, such as e-cigarettes. If you need help quitting, ask your health care provider. Do not use street drugs. Do not share needles. Ask your health care provider for help if you need support or information about quitting drugs. General instructions Schedule regular health, dental, and eye exams. Stay current with your vaccines. Tell your health care provider if: You often feel depressed. You have ever been abused or do not feel safe at home. Summary Adopting a healthy lifestyle and getting preventive care are important in promoting health and wellness. Follow your health care provider's instructions about healthy diet, exercising, and getting tested or screened for diseases. Follow your health care provider's instructions on monitoring your cholesterol and blood pressure. This information is not intended to replace advice given to you by your health care provider. Make sure you discuss any questions you have with your health care provider. Document Revised: 01/08/2021 Document Reviewed: 01/08/2021 Elsevier Patient Education  2023 Elsevier Inc.  

## 2022-06-06 NOTE — Progress Notes (Addendum)
Subjective:    Patient ID: Charlcie Cradle, male    DOB: [birth year], 61 y.o.   MRN: [id number]  Chief Complaint  Patient presents with   Medical Management of Chronic Issues    No concerns, Patient is fasting    PT presents to the office today for CPE. He lost his wife of 68 years on 10/2021.  He is a current 1-2 pack smoker a day for the last 35 years.  Hypertension This is a chronic problem. The current episode started more than 1 year ago. The problem has been resolved since onset. The problem is controlled. Pertinent negatives include no malaise/fatigue, peripheral edema or shortness of breath. Risk factors for coronary artery disease include dyslipidemia, male gender, obesity and smoking/tobacco exposure. The current treatment provides moderate improvement. There is no history of heart failure.  Nicotine Dependence Presents for follow-up visit. His urge triggers include company of smokers. The symptoms have been stable. He smokes 2 packs of cigarettes per day.      Review of Systems  Constitutional:  Negative for malaise/fatigue.  Respiratory:  Negative for shortness of breath.   All other systems reviewed and are negative.   Family History  Problem Relation Age of Onset   Arthritis Mother    Hypertension Mother    Hyperlipidemia Mother    Cancer Mother        Brain tumor.   Diabetes Mother    Cancer Father    Cancer Brother        Bone   Social History   Socioeconomic History   Marital status: Widowed    Spouse name: Not on file   Number of children: Not on file   Years of education: Not on file   Highest education level: Not on file  Occupational History   Not on file  Tobacco Use   Smoking status: Every Day    Packs/day: 2.00    Types: Cigarettes   Smokeless tobacco: Never  Vaping Use   Vaping Use: Never used  Substance and Sexual Activity   Alcohol use: No   Drug use: No   Sexual activity: Yes  Other Topics Concern   Not on file  Social History  Narrative   Not on file   Social Determinants of Health   Financial Resource Strain: Not on file  Food Insecurity: Not on file  Transportation Needs: Not on file  Physical Activity: Not on file  Stress: Not on file  Social Connections: Not on file       Objective:   Physical Exam Vitals reviewed.  Constitutional:      General: He is not in acute distress.    Appearance: He is well-developed.  HENT:     Head: Normocephalic.     Right Ear: Tympanic membrane normal.     Left Ear: Tympanic membrane normal.  Eyes:     General:        Right eye: No discharge.        Left eye: No discharge.     Pupils: Pupils are equal, round, and reactive to light.  Neck:     Thyroid: No thyromegaly.  Cardiovascular:     Rate and Rhythm: Normal rate and regular rhythm.     Heart sounds: Murmur heard.  Pulmonary:     Effort: Pulmonary effort is normal. No respiratory distress.     Breath sounds: Normal breath sounds. No wheezing.  Abdominal:     General: Bowel sounds are normal. There  is no distension.     Palpations: Abdomen is soft.     Tenderness: There is no abdominal tenderness.  Musculoskeletal:        General: No tenderness. Normal range of motion.     Cervical back: Normal range of motion and neck supple.  Skin:    General: Skin is warm and dry.     Findings: No erythema or rash.  Neurological:     Mental Status: He is alert and oriented to person, place, and time.     Cranial Nerves: No cranial nerve deficit.     Deep Tendon Reflexes: Reflexes are normal and symmetric.  Psychiatric:        Behavior: Behavior normal.        Thought Content: Thought content normal.        Judgment: Judgment normal.      BP 129/74   Pulse 60   Temp 97.6 F (36.4 C) (Temporal)   Ht _0  (1.803 m)   Wt 232 lb 6.4 oz (105.4 kg)   SpO2 96%   BMI 32.41 kg/m      Assessment & Plan:  Charlcie Cradle comes in today with chief complaint of Medical Management of Chronic Issues (No  concerns, Patient is fasting )   Diagnosis and orders addressed:  1. Primary hypertension - hydrochlorothiazide (HYDRODIURIL) 25 MG tablet; Take 1 tablet (25 mg total) by mouth daily.  Dispense: 90 tablet; Refill: 3 - CMP14+EGFR - CBC with Differential/Platelet  2. Annual physical exam - CMP14+EGFR - CBC with Differential/Platelet - Lipid panel - PSA, total and free - TSH - VITAMIN D 25 Hydroxy (Vit-D Deficiency, Fractures)  3. Current smoker - CMP14+EGFR - CBC with Differential/Platelet - CT CHEST LUNG CA SCREEN LOW DOSE W/O CM; Future  4. Obesity (BMI 30-39.9) - CMP14+EGFR - CBC with Differential/Platelet  5. Vitamin D deficiency - CMP14+EGFR - CBC with Differential/Platelet - VITAMIN D 25 Hydroxy (Vit-D Deficiency, Fractures)  6. Encounter for screening for lung cancer - CT CHEST LUNG CA SCREEN LOW DOSE W/O CM; Future   Labs pending Health Maintenance reviewed Diet and exercise encouraged  Follow up plan: 6 months   Evelina Dun, FNP

## 2022-06-07 ENCOUNTER — Other Ambulatory Visit: Payer: Self-pay | Admitting: Family

## 2022-06-07 LAB — CBC WITH DIFFERENTIAL/PLATELET
Basophils Absolute: 0.1 10*3/uL (ref 0.0–0.2)
Basos: 1 %
EOS (ABSOLUTE): 0.1 10*3/uL (ref 0.0–0.4)
Eos: 2 %
Hematocrit: 47.3 % (ref 37.5–51.0)
Hemoglobin: 16.2 g/dL (ref 13.0–17.7)
Immature Grans (Abs): 0 10*3/uL (ref 0.0–0.1)
Immature Granulocytes: 1 %
Lymphocytes Absolute: 1.7 10*3/uL (ref 0.7–3.1)
Lymphs: 27 %
MCH: 28.9 pg (ref 26.6–33.0)
MCHC: 34.2 g/dL (ref 31.5–35.7)
MCV: 85 fL (ref 79–97)
Monocytes Absolute: 0.5 10*3/uL (ref 0.1–0.9)
Monocytes: 8 %
Neutrophils Absolute: 3.9 10*3/uL (ref 1.4–7.0)
Neutrophils: 61 %
Platelets: 195 10*3/uL (ref 150–450)
RBC: 5.6 x10E6/uL (ref 4.14–5.80)
RDW: 12.9 % (ref 11.6–15.4)
WBC: 6.2 10*3/uL (ref 3.4–10.8)

## 2022-06-07 LAB — CMP14+EGFR
ALT: 10 IU/L (ref 0–44)
AST: 12 IU/L (ref 0–40)
Albumin/Globulin Ratio: 2 (ref 1.2–2.2)
Albumin: 4.2 g/dL (ref 3.8–4.9)
Alkaline Phosphatase: 90 IU/L (ref 44–121)
BUN/Creatinine Ratio: 12 (ref 10–24)
BUN: 11 mg/dL (ref 8–27)
Bilirubin Total: 0.5 mg/dL (ref 0.0–1.2)
CO2: 22 mmol/L (ref 20–29)
Calcium: 9.1 mg/dL (ref 8.6–10.2)
Chloride: 103 mmol/L (ref 96–106)
Creatinine, Ser: 0.95 mg/dL (ref 0.76–1.27)
Globulin, Total: 2.1 g/dL (ref 1.5–4.5)
Glucose: 107 mg/dL — ABNORMAL HIGH (ref 70–99)
Potassium: 4.1 mmol/L (ref 3.5–5.2)
Sodium: 139 mmol/L (ref 134–144)
Total Protein: 6.3 g/dL (ref 6.0–8.5)
eGFR: 92 mL/min/{1.73_m2} (ref >59)

## 2022-06-07 LAB — LIPID PANEL
Chol/HDL Ratio: 3.2 ratio (ref 0.0–5.0)
Cholesterol, Total: 146 mg/dL (ref 100–199)
HDL: 46 mg/dL (ref >39)
LDL Chol Calc (NIH): 78 mg/dL (ref 0–99)
Triglycerides: 124 mg/dL (ref 0–149)
VLDL Cholesterol Cal: 22 mg/dL (ref 5–40)

## 2022-06-07 LAB — PSA, TOTAL AND FREE
PSA, Free Pct: 30 %
PSA, Free: 0.18 ng/mL
Prostate Specific Ag, Serum: 0.6 ng/mL (ref 0.0–4.0)

## 2022-06-07 LAB — VITAMIN D 25 HYDROXY (VIT D DEFICIENCY, FRACTURES): Vit D, 25-Hydroxy: 28.8 ng/mL — ABNORMAL LOW (ref 30.0–100.0)

## 2022-06-07 LAB — TSH: TSH: 2.12 u[IU]/mL (ref 0.450–4.500)

## 2022-06-07 MED ORDER — VITAMIN D (ERGOCALCIFEROL) 1.25 MG (50000 UNIT) PO CAPS: 50000.0000 [IU] | ORAL_CAPSULE | ORAL | 3 refills | Status: DC

## 2022-06-07 MED ORDER — ROSUVASTATIN CALCIUM 5 MG PO TABS: 5.0000 mg | ORAL_TABLET | Freq: Every day | ORAL | 3 refills | Status: DC

## 2022-06-13 ENCOUNTER — Ambulatory Visit: Payer: BC Managed Care – PPO | Admitting: Family

## 2022-06-13 ENCOUNTER — Encounter: Payer: Self-pay | Admitting: Family

## 2022-06-13 VITALS — BP 109/64 | HR 72 | Temp 97.3°F | Ht 71.0 in | Wt 225.6 lb

## 2022-06-13 DIAGNOSIS — R109 Unspecified abdominal pain: Secondary | ICD-10-CM | POA: Diagnosis not present

## 2022-06-13 DIAGNOSIS — R1030 Lower abdominal pain, unspecified: Secondary | ICD-10-CM | POA: Diagnosis not present

## 2022-06-13 LAB — MICROSCOPIC EXAMINATION
Bacteria, UA: NONE SEEN
Renal Epithel, UA: NONE SEEN /hpf

## 2022-06-13 LAB — URINALYSIS, COMPLETE
Bilirubin, UA: NEGATIVE
Glucose, UA: NEGATIVE
Ketones, UA: NEGATIVE
Leukocytes,UA: NEGATIVE
Nitrite, UA: NEGATIVE
Specific Gravity, UA: 1.02 (ref 1.005–1.030)
Urobilinogen, Ur: >8 mg/dL — ABNORMAL HIGH (ref 0.2–1.0)
pH, UA: 7 (ref 5.0–7.5)

## 2022-06-13 MED ORDER — METRONIDAZOLE 500 MG PO TABS: 500.0000 mg | ORAL_TABLET | Freq: Three times a day (TID) | ORAL | 0 refills | Status: AC

## 2022-06-13 MED ORDER — CIPROFLOXACIN HCL 500 MG PO TABS: 500.0000 mg | ORAL_TABLET | Freq: Two times a day (BID) | ORAL | 0 refills | Status: DC

## 2022-06-13 NOTE — Patient Instructions (Signed)
Diverticulitis  Diverticulitis is infection or inflammation of small pouches (diverticula) in the colon that form due to a condition called diverticulosis. Diverticula can trap stool (feces) and bacteria, causing infection and inflammation. Diverticulitis may cause severe stomach pain and diarrhea. It may lead to tissue damage in the colon that causes bleeding or blockage. The diverticula may also burst (rupture) and cause infected stool to enter other areas of the abdomen. What are the causes? This condition is caused by stool becoming trapped in the diverticula, which allows bacteria to grow in the diverticula. This leads to inflammation and infection. What increases the risk? You are more likely to develop this condition if you have diverticulosis. The risk increases if you: Are overweight or obese. Do not get enough exercise. Drink alcohol. Use tobacco products. Eat a diet that has a lot of red meat such as beef, pork, or lamb. Eat a diet that does not include enough fiber. High-fiber foods include fruits, vegetables, beans, nuts, and whole grains. Are over 40 years of age. What are the signs or symptoms? Symptoms of this condition may include: Pain and tenderness in the abdomen. The pain is normally located on the left side of the abdomen, but it may occur in other areas. Fever and chills. Nausea. Vomiting. Cramping. Bloating. Changes in bowel routines. Blood in your stool. How is this diagnosed? This condition is diagnosed based on: Your medical history. A physical exam. Tests to make sure there is nothing else causing your condition. These tests may include: Blood tests. Urine tests. CT scan of the abdomen. How is this treated? Most cases of this condition are mild and can be treated at home. Treatment may include: Taking over-the-counter pain medicines. Following a clear liquid diet. Taking antibiotic medicines by mouth. Resting. More severe cases may need to be treated  at a hospital. Treatment may include: Not eating or drinking. Taking prescription pain medicine. Receiving antibiotic medicines through an IV. Receiving fluids and nutrition through an IV. Surgery. When your condition is under control, your health care provider may recommend that you have a colonoscopy. This is an exam to look at the entire large intestine. During the exam, a lubricated, bendable tube is inserted into the anus and then passed into the rectum, colon, and other parts of the large intestine. A colonoscopy can show how severe your diverticula are and whether something else may be causing your symptoms. Follow these instructions at home: Medicines Take over-the-counter and prescription medicines only as told by your health care provider. These include fiber supplements, probiotics, and stool softeners. If you were prescribed an antibiotic medicine, take it as told by your health care provider. Do not stop taking the antibiotic even if you start to feel better. Ask your health care provider if the medicine prescribed to you requires you to avoid driving or using machinery. Eating and drinking  Follow a full liquid diet or another diet as directed by your health care provider. After your symptoms improve, your health care provider may tell you to change your diet. He or she may recommend that you eat a diet that contains at least 25 grams (25 g) of fiber daily. Fiber makes it easier to pass stool. Healthy sources of fiber include: Berries. One cup contains 4-8 grams of fiber. Beans or lentils. One-half cup contains 5-8 grams of fiber. Green vegetables. One cup contains 4 grams of fiber. Avoid eating red meat. General instructions Do not use any products that contain nicotine or tobacco, such as   cigarettes, e-cigarettes, and chewing tobacco. If you need help quitting, ask your health care provider. Exercise for at least 30 minutes, 3 times each week. You should exercise hard enough to  raise your heart rate and break a sweat. Keep all follow-up visits as told by your health care provider. This is important. You may need to have a colonoscopy. Contact a health care provider if: Your pain does not improve. Your bowel movements do not return to normal. Get help right away if: Your pain gets worse. Your symptoms do not get better with treatment. Your symptoms suddenly get worse. You have a fever. You vomit more than one time. You have stools that are bloody, black, or tarry. Summary Diverticulitis is infection or inflammation of small pouches (diverticula) in the colon that form due to a condition called diverticulosis. Diverticula can trap stool (feces) and bacteria, causing infection and inflammation. You are at higher risk for this condition if you have diverticulosis and you eat a diet that does not include enough fiber. Most cases of this condition are mild and can be treated at home. More severe cases may need to be treated at a hospital. When your condition is under control, your health care provider may recommend that you have an exam called a colonoscopy. This exam can show how severe your diverticula are and whether something else may be causing your symptoms. Keep all follow-up visits as told by your health care provider. This is important. This information is not intended to replace advice given to you by your health care provider. Make sure you discuss any questions you have with your health care provider. Document Revised: 05/31/2019 Document Reviewed: 05/31/2019 Elsevier Patient Education  2023 [employer]  

## 2022-06-13 NOTE — Addendum Note (Signed)
Addended by: Evelina Dun A on: 06/13/2022 03:02 PM   Modules accepted: Orders

## 2022-06-13 NOTE — Progress Notes (Addendum)
Subjective:    Patient ID: Bruce [name], male    DOB: 01/11/1961, 61 y.o.   MRN: [id number]  Chief Complaint  Patient presents with   Abdominal Pain    Front LQ hurts when he urinates and when he has a Bowl movement    PT presents to the office today with lower abdominal pain that started Monday that has worsen.  Abdominal Pain This is a new problem. The onset quality is gradual. The problem occurs constantly. The problem has been gradually worsening. The pain is located in the RLQ and LLQ. The pain is at a severity of 10/10. The pain is moderate. The quality of the pain is cramping. Associated symptoms include headaches. Pertinent negatives include no belching, constipation, diarrhea, dysuria, fever, flatus, frequency, hematuria, myalgias, nausea or vomiting. The pain is aggravated by urination and bowel movement. The pain is relieved by Being still. The treatment provided mild relief.      Review of Systems  Constitutional:  Negative for fever.  Gastrointestinal:  Positive for abdominal pain. Negative for constipation, diarrhea, flatus, nausea and vomiting.  Genitourinary:  Negative for dysuria, frequency and hematuria.  Musculoskeletal:  Negative for myalgias.  Neurological:  Positive for headaches.  All other systems reviewed and are negative.      Objective:   Physical Exam Vitals reviewed.  Constitutional:      General: He is not in acute distress.    Appearance: He is well-developed. He is obese.  HENT:     Head: Normocephalic.     Right Ear: External ear normal.     Left Ear: External ear normal.  Eyes:     General:        Right eye: No discharge.        Left eye: No discharge.     Pupils: Pupils are equal, round, and reactive to light.  Neck:     Thyroid: No thyromegaly.  Cardiovascular:     Rate and Rhythm: Normal rate and regular rhythm.     Heart sounds: Normal heart sounds. No murmur heard. Pulmonary:     Effort: Pulmonary effort is normal. No  respiratory distress.     Breath sounds: Normal breath sounds. No wheezing.  Abdominal:     General: Bowel sounds are normal. There is no distension.     Palpations: Abdomen is soft.     Tenderness: There is abdominal tenderness in the right lower quadrant and left lower quadrant. There is rebound.  Musculoskeletal:        General: No tenderness. Normal range of motion.     Cervical back: Normal range of motion and neck supple.  Skin:    General: Skin is warm and dry.     Findings: No erythema or rash.  Neurological:     Mental Status: He is alert and oriented to person, place, and time.     Cranial Nerves: No cranial nerve deficit.     Deep Tendon Reflexes: Reflexes are normal and symmetric.  Psychiatric:        Behavior: Behavior normal.        Thought Content: Thought content normal.        Judgment: Judgment normal.       BP 109/64   Pulse 72   Temp (!) 97.3 F (36.3 C) (Temporal)   Ht 5\' 11"  (1.803 m)   Wt 225 lb 9.6 oz (102.3 kg)   BMI 31.46 kg/m      Assessment & Plan:  [name] comes in today with chief complaint of Abdominal Pain (Front LQ hurts when he urinates and when he has a Bowl movement )   Diagnosis and orders addressed:  1. Abdominal pain, unspecified abdominal location - Urinalysis, Complete - Urine Culture - CBC with Differential/Platelet  2. Lower abdominal pain - CT Abdomen Pelvis W Contrast; Future - CBC with Differential/Platelet  Probable diverticulitis, will get CT as he has never had this before and pain is a 10 out 10 Labs pending Avoid red dye food Avoid food until cleared from CT scan   Can not get scan until tomorrow at 9 am. Will send in Cipro and Flagyl Red flags discussed to go to ED.  [name], FNP

## 2022-06-14 ENCOUNTER — Ambulatory Visit
Admission: RE | Admit: 2022-06-14 | Discharge: 2022-06-14 | Disposition: A | Discharge status: Home / Self Care | Payer: BC Managed Care – PPO | Source: Ambulatory Visit | Attending: Family | Admitting: Family

## 2022-06-14 DIAGNOSIS — R1030 Lower abdominal pain, unspecified: Secondary | ICD-10-CM

## 2022-06-14 DIAGNOSIS — I700 Atherosclerosis of aorta: Secondary | ICD-10-CM | POA: Diagnosis not present

## 2022-06-14 DIAGNOSIS — K769 Liver disease, unspecified: Secondary | ICD-10-CM | POA: Diagnosis not present

## 2022-06-14 DIAGNOSIS — K314 Gastric diverticulum: Secondary | ICD-10-CM | POA: Diagnosis not present

## 2022-06-14 DIAGNOSIS — K5732 Diverticulitis of large intestine without perforation or abscess without bleeding: Secondary | ICD-10-CM | POA: Diagnosis not present

## 2022-06-14 LAB — CBC WITH DIFFERENTIAL/PLATELET
Basophils Absolute: 0.1 10*3/uL (ref 0.0–0.2)
Basos: 0 %
EOS (ABSOLUTE): 0.1 10*3/uL (ref 0.0–0.4)
Eos: 1 %
Hematocrit: 47.5 % (ref 37.5–51.0)
Hemoglobin: 16.3 g/dL (ref 13.0–17.7)
Immature Grans (Abs): 0 10*3/uL (ref 0.0–0.1)
Immature Granulocytes: 0 %
Lymphocytes Absolute: 1.9 10*3/uL (ref 0.7–3.1)
Lymphs: 15 %
MCH: 29.2 pg (ref 26.6–33.0)
MCHC: 34.3 g/dL (ref 31.5–35.7)
MCV: 85 fL (ref 79–97)
Monocytes Absolute: 1.3 10*3/uL — ABNORMAL HIGH (ref 0.1–0.9)
Monocytes: 10 %
Neutrophils Absolute: 9.8 10*3/uL — ABNORMAL HIGH (ref 1.4–7.0)
Neutrophils: 74 %
Platelets: 188 10*3/uL (ref 150–450)
RBC: 5.59 x10E6/uL (ref 4.14–5.80)
RDW: 13 % (ref 11.6–15.4)
WBC: 13.2 10*3/uL — ABNORMAL HIGH (ref 3.4–10.8)

## 2022-06-14 MED ORDER — IOPAMIDOL (ISOVUE-300) INJECTION 61%
100.0000 mL | Freq: Once | INTRAVENOUS | Status: AC | PRN
  Administered 2022-06-14: 100 mL via INTRAVENOUS

## 2022-06-16 LAB — URINE CULTURE: Organism ID, Bacteria: NO GROWTH

## 2022-06-24 ENCOUNTER — Encounter: Payer: Self-pay | Admitting: Family

## 2022-06-24 ENCOUNTER — Ambulatory Visit: Payer: BC Managed Care – PPO | Admitting: Family

## 2022-06-24 VITALS — BP 121/68 | HR 66 | Temp 97.7°F | Ht 71.0 in | Wt 223.0 lb

## 2022-06-24 DIAGNOSIS — F17200 Nicotine dependence, unspecified, uncomplicated: Secondary | ICD-10-CM | POA: Diagnosis not present

## 2022-06-24 DIAGNOSIS — K5792 Diverticulitis of intestine, part unspecified, without perforation or abscess without bleeding: Secondary | ICD-10-CM | POA: Diagnosis not present

## 2022-06-24 DIAGNOSIS — I700 Atherosclerosis of aorta: Secondary | ICD-10-CM

## 2022-06-24 NOTE — Progress Notes (Addendum)
   Subjective:    Patient ID: Bruce [name], male    DOB: [birth year], 61 y.o.   MRN: [id number]  Chief Complaint  Patient presents with   Nausea   Follow-up    No pain but has nausea    HPI Pt presents to the office today to follow up on diverticulitis. He had a CT on 06/14/22 that showed, "1. Acute sigmoid diverticulitis. No perforation or abscess. 2.  Aortic Atherosclerosis (ICD10-I70.0)."  He was started on Cipro and Flagyl. States his pain has greatly resolved. Reports a mild soreness 1 out 10. He is having some nausea.   He has Aortic Atherosclerosis and takes crestor. He is a smoker of a pack a day.    Review of Systems  All other systems reviewed and are negative.      Objective:   Physical Exam Vitals reviewed.  Constitutional:      General: He is not in acute distress.    Appearance: He is well-developed.  HENT:     Head: Normocephalic.     Right Ear: Tympanic membrane normal.     Left Ear: Tympanic membrane normal.  Eyes:     General:        Right eye: No discharge.        Left eye: No discharge.     Pupils: Pupils are equal, round, and reactive to light.  Neck:     Thyroid: No thyromegaly.  Cardiovascular:     Rate and Rhythm: Normal rate and regular rhythm.     Heart sounds: Murmur heard.  Pulmonary:     Effort: Pulmonary effort is normal. No respiratory distress.     Breath sounds: Normal breath sounds. No wheezing.  Abdominal:     General: Bowel sounds are normal. There is no distension.     Palpations: Abdomen is soft.     Tenderness: There is no abdominal tenderness.  Musculoskeletal:        General: No tenderness. Normal range of motion.     Cervical back: Normal range of motion and neck supple.  Skin:    General: Skin is warm and dry.     Findings: No erythema or rash.  Neurological:     Mental Status: He is alert and oriented to person, place, and time.     Cranial Nerves: No cranial nerve deficit.     Deep Tendon Reflexes: Reflexes are  normal and symmetric.  Psychiatric:        Behavior: Behavior normal.        Thought Content: Thought content normal.        Judgment: Judgment normal.          BP 121/68   Pulse 66   Temp 97.7 F (36.5 C) (Temporal)   Ht 5\' 11"  (1.803 m)   Wt 223 lb (101.2 kg)   SpO2 95%   BMI 31.10 kg/m   Assessment & Plan:  Bruce [name] comes in today with chief complaint of Nausea and Follow-up (No pain but has nausea )   Diagnosis and orders addressed:  1. Aortic atherosclerosis (HCC) - CBC with Differential/Platelet  2. Diverticulitis - CBC with Differential/Platelet  3. Current smoker   Smoking cessation  Continue statin Report any fevers, abdominal pain. Labs pending    Evelina Dun, FNP

## 2022-06-24 NOTE — Patient Instructions (Signed)
Diverticulitis  Diverticulitis is infection or inflammation of small pouches (diverticula) in the colon that form due to a condition called diverticulosis. Diverticula can trap stool (feces) and bacteria, causing infection and inflammation. Diverticulitis may cause severe stomach pain and diarrhea. It may lead to tissue damage in the colon that causes bleeding or blockage. The diverticula may also burst (rupture) and cause infected stool to enter other areas of the abdomen. What are the causes? This condition is caused by stool becoming trapped in the diverticula, which allows bacteria to grow in the diverticula. This leads to inflammation and infection. What increases the risk? You are more likely to develop this condition if you have diverticulosis. The risk increases if you: Are overweight or obese. Do not get enough exercise. Drink alcohol. Use tobacco products. Eat a diet that has a lot of red meat such as beef, pork, or lamb. Eat a diet that does not include enough fiber. High-fiber foods include fruits, vegetables, beans, nuts, and whole grains. Are over 40 years of age. What are the signs or symptoms? Symptoms of this condition may include: Pain and tenderness in the abdomen. The pain is normally located on the left side of the abdomen, but it may occur in other areas. Fever and chills. Nausea. Vomiting. Cramping. Bloating. Changes in bowel routines. Blood in your stool. How is this diagnosed? This condition is diagnosed based on: Your medical history. A physical exam. Tests to make sure there is nothing else causing your condition. These tests may include: Blood tests. Urine tests. CT scan of the abdomen. How is this treated? Most cases of this condition are mild and can be treated at home. Treatment may include: Taking over-the-counter pain medicines. Following a clear liquid diet. Taking antibiotic medicines by mouth. Resting. More severe cases may need to be treated  at a hospital. Treatment may include: Not eating or drinking. Taking prescription pain medicine. Receiving antibiotic medicines through an IV. Receiving fluids and nutrition through an IV. Surgery. When your condition is under control, your health care provider may recommend that you have a colonoscopy. This is an exam to look at the entire large intestine. During the exam, a lubricated, bendable tube is inserted into the anus and then passed into the rectum, colon, and other parts of the large intestine. A colonoscopy can show how severe your diverticula are and whether something else may be causing your symptoms. Follow these instructions at home: Medicines Take over-the-counter and prescription medicines only as told by your health care provider. These include fiber supplements, probiotics, and stool softeners. If you were prescribed an antibiotic medicine, take it as told by your health care provider. Do not stop taking the antibiotic even if you start to feel better. Ask your health care provider if the medicine prescribed to you requires you to avoid driving or using machinery. Eating and drinking  Follow a full liquid diet or another diet as directed by your health care provider. After your symptoms improve, your health care provider may tell you to change your diet. He or she may recommend that you eat a diet that contains at least 25 grams (25 g) of fiber daily. Fiber makes it easier to pass stool. Healthy sources of fiber include: Berries. One cup contains 4-8 grams of fiber. Beans or lentils. One-half cup contains 5-8 grams of fiber. Green vegetables. One cup contains 4 grams of fiber. Avoid eating red meat. General instructions Do not use any products that contain nicotine or tobacco, such as   cigarettes, e-cigarettes, and chewing tobacco. If you need help quitting, ask your health care provider. Exercise for at least 30 minutes, 3 times each week. You should exercise hard enough to  raise your heart rate and break a sweat. Keep all follow-up visits as told by your health care provider. This is important. You may need to have a colonoscopy. Contact a health care provider if: Your pain does not improve. Your bowel movements do not return to normal. Get help right away if: Your pain gets worse. Your symptoms do not get better with treatment. Your symptoms suddenly get worse. You have a fever. You vomit more than one time. You have stools that are bloody, black, or tarry. Summary Diverticulitis is infection or inflammation of small pouches (diverticula) in the colon that form due to a condition called diverticulosis. Diverticula can trap stool (feces) and bacteria, causing infection and inflammation. You are at higher risk for this condition if you have diverticulosis and you eat a diet that does not include enough fiber. Most cases of this condition are mild and can be treated at home. More severe cases may need to be treated at a hospital. When your condition is under control, your health care provider may recommend that you have an exam called a colonoscopy. This exam can show how severe your diverticula are and whether something else may be causing your symptoms. Keep all follow-up visits as told by your health care provider. This is important. This information is not intended to replace advice given to you by your health care provider. Make sure you discuss any questions you have with your health care provider. Document Revised: 05/31/2019 Document Reviewed: 05/31/2019 Elsevier Patient Education  2023 [employer]  

## 2022-06-25 LAB — CBC WITH DIFFERENTIAL/PLATELET
Basophils Absolute: 0.1 10*3/uL (ref 0.0–0.2)
Basos: 1 %
EOS (ABSOLUTE): 0.1 10*3/uL (ref 0.0–0.4)
Eos: 1 %
Hematocrit: 48.2 % (ref 37.5–51.0)
Hemoglobin: 16.2 g/dL (ref 13.0–17.7)
Immature Grans (Abs): 0.1 10*3/uL (ref 0.0–0.1)
Immature Granulocytes: 1 %
Lymphocytes Absolute: 1.7 10*3/uL (ref 0.7–3.1)
Lymphs: 17 %
MCH: 28.5 pg (ref 26.6–33.0)
MCHC: 33.6 g/dL (ref 31.5–35.7)
MCV: 85 fL (ref 79–97)
Monocytes Absolute: 0.7 10*3/uL (ref 0.1–0.9)
Monocytes: 7 %
Neutrophils Absolute: 7.1 10*3/uL — ABNORMAL HIGH (ref 1.4–7.0)
Neutrophils: 73 %
Platelets: 324 10*3/uL (ref 150–450)
RBC: 5.69 x10E6/uL (ref 4.14–5.80)
RDW: 12.8 % (ref 11.6–15.4)
WBC: 9.8 10*3/uL (ref 3.4–10.8)

## 2022-07-08 ENCOUNTER — Encounter: Payer: Self-pay | Admitting: Family

## 2022-07-08 ENCOUNTER — Ambulatory Visit: Payer: BC Managed Care – PPO | Admitting: Family

## 2022-07-08 ENCOUNTER — Telehealth: Payer: Self-pay | Admitting: Family

## 2022-07-08 DIAGNOSIS — N529 Male erectile dysfunction, unspecified: Secondary | ICD-10-CM | POA: Diagnosis not present

## 2022-07-08 MED ORDER — SILDENAFIL CITRATE 100 MG PO TABS: 50.0000 mg | ORAL_TABLET | Freq: Every day | ORAL | 2 refills | Status: DC | PRN

## 2022-07-08 NOTE — Progress Notes (Signed)
Virtual Visit  Note Due to COVID-19 pandemic this visit was conducted virtually. This visit type was conducted due to national recommendations for restrictions regarding the COVID-19 Pandemic (e.g. social distancing, sheltering in place) in an effort to limit this patient's exposure and mitigate transmission in our community. All issues noted in this document were discussed and addressed.  A physical exam was not performed with this format.  I connected with Bruce [name] on 07/08/22 at 11:55 pm by telephone and verified that I am speaking with the correct person using two identifiers. Bruce [name] is currently located at home and no one is currently with him during visit. The provider, Evelina Dun, FNP is located in their office at time of visit.  I discussed the limitations, risks, security and privacy concerns of performing an evaluation and management service by telephone and the availability of in person appointments. I also discussed with the patient that there may be a patient responsible charge related to this service. The patient expressed understanding and agreed to proceed.  Bruce [name], Bruce [name] are scheduled for a virtual visit with your provider today.    Just as we do with appointments in the office, we must obtain your consent to participate.  Your consent will be active for this visit and any virtual visit you may have with one of our providers in the next 365 days.    If you have a MyChart account, I can also send a copy of this consent to you electronically.  All virtual visits are billed to your insurance company just like a traditional visit in the office.  As this is a virtual visit, video technology does not allow for your provider to perform a traditional examination.  This may limit your provider's ability to fully assess your condition.  If your provider identifies any concerns that need to be evaluated in person or the need to arrange testing such as labs, EKG, etc, we  will make arrangements to do so.    Although advances in technology are sophisticated, we cannot ensure that it will always work on either your end or our end.  If the connection with a video visit is poor, we may have to switch to a telephone visit.  With either a video or telephone visit, we are not always able to ensure that we have a secure connection.   I need to obtain your verbal consent now.   Are you willing to proceed with your visit today?   Bruce [name] has provided verbal consent on 07/08/2022 for a virtual visit (video or telephone).   Evelina Dun, [place] 07/08/2022  12:01 PM    History and Present Illness:   HPI PT calls the office today with complaints of ED. His wife passed away 31-Oct-2021 of 44 years. He recently met a new girlfriend. He is able to get an erection, but not keep one. He is having a lot of stress with this.    Review of Systems  All other systems reviewed and are negative.    Observations/Objective: No SOB or distress noted   Assessment and Plan: 1. Erectile dysfunction, unspecified erectile dysfunction type Will give Viagra I think a lot of this is related to nerves and anxiety about a new woman.  Stress management  Keep chronic follow up - sildenafil (VIAGRA) 100 MG tablet; Take 0.5-1 tablets (50-100 mg total) by mouth daily as needed for erectile dysfunction.  Dispense: 30 tablet; Refill: 2    I discussed the  assessment and treatment plan with the patient. The patient was provided an opportunity to ask questions and all were answered. The patient agreed with the plan and demonstrated an understanding of the instructions.   The patient was advised to call back or seek an in-person evaluation if the symptoms worsen or if the condition fails to improve as anticipated.  The above assessment and management plan was discussed with the patient. The patient verbalized understanding of and has agreed to the management plan. Patient is aware to call the  clinic if symptoms persist or worsen. Patient is aware when to return to the clinic for a follow-up visit. Patient educated on when it is appropriate to go to the emergency department.   Time call ended:    I provided 12 minutes of  non face-to-face time during this encounter.    Evelina Dun, FNP

## 2022-07-08 NOTE — Telephone Encounter (Signed)
Patient has telephone appt with [facility] for personal appt made

## 2022-09-04 ENCOUNTER — Other Ambulatory Visit: Payer: Self-pay

## 2022-09-04 ENCOUNTER — Telehealth: Payer: Self-pay | Admitting: Acute Care

## 2022-09-04 DIAGNOSIS — Z87891 Personal history of nicotine dependence: Secondary | ICD-10-CM

## 2022-09-04 DIAGNOSIS — Z122 Encounter for screening for malignant neoplasm of respiratory organs: Secondary | ICD-10-CM

## 2022-09-04 DIAGNOSIS — F17210 Nicotine dependence, cigarettes, uncomplicated: Secondary | ICD-10-CM

## 2022-09-04 NOTE — Telephone Encounter (Signed)
Patient called and left a voice message regarding setting up a scan.  I called him back and left him a voice message telling him to call the main office phone [phone].   Bruce [name] will also contact the patient to set up this scan, based on a referral from Kayren Eaves, NP

## 2022-11-19 ENCOUNTER — Encounter: Payer: Self-pay | Admitting: Acute Care

## 2022-11-19 ENCOUNTER — Ambulatory Visit (INDEPENDENT_AMBULATORY_CARE_PROVIDER_SITE_OTHER): Payer: BC Managed Care – PPO | Admitting: Acute Care

## 2022-11-19 DIAGNOSIS — F17210 Nicotine dependence, cigarettes, uncomplicated: Secondary | ICD-10-CM | POA: Diagnosis not present

## 2022-11-19 NOTE — Progress Notes (Signed)
Virtual Visit via Telephone Note  I connected with Charlcie Cradle on 11/19/22 at  4:00 PM EDT by telephone and verified that I am speaking with the correct person using two identifiers.  Location: Patient:  At home Provider: [place].    I discussed the limitations, risks, security and privacy concerns of performing an evaluation and management service by telephone and the availability of in person appointments. I also discussed with the patient that there may be a patient responsible charge related to this service. The patient expressed understanding and agreed to proceed.    Shared Decision Making Visit Lung Cancer Screening Program [phone])   Eligibility: Age 62 y.o. Pack Years Smoking History Calculation 44 pack year smoking history (# packs/per year x # years smoked) Recent History of coughing up blood  no Unexplained weight loss? no ( >Than 15 pounds within the last 6 months ) Prior History Lung / other cancer no (Diagnosis within the last 5 years already requiring surveillance chest CT Scans). Smoking Status Current Smoker Former Smokers: Years since quit:  NA  Quit Date:  NA  Visit Components: Discussion included one or more decision making aids. yes Discussion included risk/benefits of screening. yes Discussion included potential follow up diagnostic testing for abnormal scans. yes Discussion included meaning and risk of over diagnosis. yes Discussion included meaning and risk of False Positives. yes Discussion included meaning of total radiation exposure. yes  Counseling Included: Importance of adherence to annual lung cancer LDCT screening. yes Impact of comorbidities on ability to participate in the program. yes Ability and willingness to under diagnostic treatment. yes  Smoking Cessation Counseling: Current Smokers:  Discussed importance of smoking cessation. yes Information about tobacco cessation classes and interventions provided to  patient. yes Patient provided with "ticket" for LDCT Scan. yes Symptomatic Patient. no  Counseling NA Diagnosis Code: Tobacco Use Z72.0 Asymptomatic Patient yes  Counseling (Intermediate counseling: > three minutes counseling) [id number] Former Smokers:  Discussed the importance of maintaining cigarette abstinence. yes Diagnosis Code: Personal History of Nicotine Dependence. [id number] Information about tobacco cessation classes and interventions provided to patient. Yes Patient provided with "ticket" for LDCT Scan. yes Written Order for Lung Cancer Screening with LDCT placed in Epic. Yes (CT Chest Lung Cancer Screening Low Dose W/O CM) [id number] Z12.2-Screening of respiratory organs Z87.891-Personal history of nicotine dependence  I have spent 25 minutes of face to face/ virtual visit   time with  Mr. Stoltzfoos discussing the risks and benefits of lung cancer screening. We viewed / discussed a power point together that explained in detail the above noted topics. We paused at intervals to allow for questions to be asked and answered to ensure understanding.We discussed that the single most powerful action that he can take to decrease his risk of developing lung cancer is to quit smoking. We discussed whether or not he is ready to commit to setting a quit date. We discussed options for tools to aid in quitting smoking including nicotine replacement therapy, non-nicotine medications, support groups, Quit Smart classes, and behavior modification. We discussed that often times setting smaller, more achievable goals, such as eliminating 1 cigarette a day for a week and then 2 cigarettes a day for a week can be helpful in slowly decreasing the number of cigarettes smoked. This allows for a sense of accomplishment as well as providing a clinical benefit. I provided  him  with smoking cessation  information  with contact information for community resources, classes, free  nicotine replacement therapy, and access to  mobile apps, text messaging, and on-line smoking cessation help. I have also provided  him  the office contact information in the event he needs to contact me, or the screening staff. We discussed the time and location of the scan, and that either Doroteo Glassman RN, Joella Prince, RN  or I will call / send a letter with the results within 24-72 hours of receiving them. The patient verbalized understanding of all of  the above and had no further questions upon leaving the office. They have my contact information in the event they have any further questions.  I spent 3 minutes counseling on smoking cessation and the health risks of continued tobacco abuse.  I explained to the patient that there has been a high incidence of coronary artery disease noted on these exams. I explained that this is a non-gated exam therefore degree or severity cannot be determined. This patient is on statin therapy. I have asked the patient to follow-up with their PCP regarding any incidental finding of coronary artery disease and management with diet or medication as their PCP  feels is clinically indicated. The patient verbalized understanding of the above and had no further questions upon completion of the visit.      Magdalen Spatz, NP 11/19/2022

## 2022-11-19 NOTE — Patient Instructions (Signed)
Thank you for participating in the [facility]. It was our pleasure to meet you today. We will call you with the results of your scan within the next few days. Your scan will be assigned a Lung RADS category score by the physicians reading the scans.  This Lung RADS score determines follow up scanning.  See below for description of categories, and follow up screening recommendations. We will be in touch to schedule your follow up screening annually or based on recommendations of our providers. We will fax a copy of your scan results to your Primary Care Physician, or the physician who referred you to the program, to ensure they have the results. Please call the office if you have any questions or concerns regarding your scanning experience or results.  Our office number is [phone]. Please speak with Denise Phelps, RN. , or  Denise Buckner RN, They are  our Lung Cancer Screening RN.'s If They are unavailable when you call, Please leave a message on the voice mail. We will return your call at our earliest convenience.This voice mail is monitored several times a day.  Remember, if your scan is normal, we will scan you annually as long as you continue to meet the criteria for the program. (Age 50-80, Current smoker or smoker who has quit within the last 15 years). If you are a smoker, remember, quitting is the single most powerful action that you can take to decrease your risk of lung cancer and other pulmonary, breathing related problems. We know quitting is hard, and we are here to help.  Please let us know if there is anything we can do to help you meet your goal of quitting. If you are a former smoker, congratulations. We are proud of you! Remain smoke free! Remember you can refer friends or family members through the number above.  We will screen them to make sure they meet criteria for the program. Thank you for helping us take better care of you by  participating in Lung Screening.  You can receive free nicotine replacement therapy ( patches, gum or mints) by calling 1-800-QUIT NOW. Please call so we can get you on the path to becoming  a non-smoker. I know it is hard, but you can do this!  Lung RADS Categories:  Lung RADS 1: no nodules or definitely non-concerning nodules.  Recommendation is for a repeat annual scan in 12 months.  Lung RADS 2:  nodules that are non-concerning in appearance and behavior with a very low likelihood of becoming an active cancer. Recommendation is for a repeat annual scan in 12 months.  Lung RADS 3: nodules that are probably non-concerning , includes nodules with a low likelihood of becoming an active cancer.  Recommendation is for a 6-month repeat screening scan. Often noted after an upper respiratory illness. We will be in touch to make sure you have no questions, and to schedule your 6-month scan.  Lung RADS 4 A: nodules with concerning findings, recommendation is most often for a follow up scan in 3 months or additional testing based on our provider's assessment of the scan. We will be in touch to make sure you have no questions and to schedule the recommended 3 month follow up scan.  Lung RADS 4 B:  indicates findings that are concerning. We will be in touch with you to schedule additional diagnostic testing based on our provider's  assessment of the scan.  Other options for assistance in smoking cessation (   As covered by your insurance benefits)  Hypnosis for smoking cessation  [employer] [phone]  Acupuncture for smoking cessation  [facility] [phone]   

## 2022-11-20 ENCOUNTER — Ambulatory Visit
Admission: RE | Admit: 2022-11-20 | Discharge: 2022-11-20 | Disposition: A | Discharge status: Home / Self Care | Payer: BC Managed Care – PPO | Source: Ambulatory Visit | Attending: Family | Admitting: Family

## 2022-11-20 DIAGNOSIS — F17210 Nicotine dependence, cigarettes, uncomplicated: Secondary | ICD-10-CM

## 2022-11-20 DIAGNOSIS — Z122 Encounter for screening for malignant neoplasm of respiratory organs: Secondary | ICD-10-CM

## 2022-11-20 DIAGNOSIS — Z87891 Personal history of nicotine dependence: Secondary | ICD-10-CM

## 2022-11-21 ENCOUNTER — Other Ambulatory Visit: Payer: Self-pay | Admitting: Family

## 2022-11-21 NOTE — Progress Notes (Signed)
R/c

## 2022-11-22 ENCOUNTER — Other Ambulatory Visit: Payer: Self-pay | Admitting: Acute Care

## 2022-11-22 DIAGNOSIS — Z122 Encounter for screening for malignant neoplasm of respiratory organs: Secondary | ICD-10-CM

## 2022-11-22 DIAGNOSIS — Z87891 Personal history of nicotine dependence: Secondary | ICD-10-CM

## 2022-11-22 DIAGNOSIS — F17210 Nicotine dependence, cigarettes, uncomplicated: Secondary | ICD-10-CM

## 2023-03-29 DIAGNOSIS — M47812 Spondylosis without myelopathy or radiculopathy, cervical region: Secondary | ICD-10-CM | POA: Diagnosis not present

## 2023-03-29 DIAGNOSIS — M25511 Pain in right shoulder: Secondary | ICD-10-CM | POA: Diagnosis not present

## 2023-03-29 DIAGNOSIS — M62838 Other muscle spasm: Secondary | ICD-10-CM | POA: Diagnosis not present

## 2023-03-29 DIAGNOSIS — M5412 Radiculopathy, cervical region: Secondary | ICD-10-CM | POA: Diagnosis not present

## 2023-03-31 ENCOUNTER — Telehealth: Payer: Self-pay | Admitting: Family

## 2023-03-31 NOTE — Telephone Encounter (Signed)
Called and discussed with patient. Will give prednisone time to help. Can cancel appointment for tomorrow. He will call back later in the week if pain is not improved or worsen.   [name], FNP

## 2023-03-31 NOTE — Telephone Encounter (Signed)
Pt called stating that he had some teeth pulled recently and afterwards had some mouth pain so he went to urgent care and was prescribed some prednisone and was told that his mouth didn't look infected; just inflamed. Was told that the reactions he was having could be coming from the numbing medication that the dentist used when pulling his teeth. Wants to know what Neysa Bonito thinks. He is scheduled to see [facility] tomorrow unless [place] feels like he doesn't need to be seen.

## 2023-04-01 ENCOUNTER — Ambulatory Visit: Payer: BC Managed Care – PPO

## 2023-06-06 ENCOUNTER — Other Ambulatory Visit: Payer: Self-pay | Admitting: Family

## 2023-06-06 DIAGNOSIS — Z1212 Encounter for screening for malignant neoplasm of rectum: Secondary | ICD-10-CM

## 2023-06-06 DIAGNOSIS — Z1211 Encounter for screening for malignant neoplasm of colon: Secondary | ICD-10-CM

## 2023-06-30 ENCOUNTER — Other Ambulatory Visit: Payer: Self-pay | Admitting: Family

## 2023-06-30 DIAGNOSIS — I10 Essential (primary) hypertension: Secondary | ICD-10-CM

## 2023-07-30 ENCOUNTER — Other Ambulatory Visit: Payer: Self-pay | Admitting: Family

## 2023-07-30 DIAGNOSIS — I10 Essential (primary) hypertension: Secondary | ICD-10-CM

## 2023-07-30 NOTE — Telephone Encounter (Signed)
Patient needs an appointment to be seen

## 2023-08-14 ENCOUNTER — Telehealth: Payer: BC Managed Care – PPO | Admitting: Family

## 2023-08-14 ENCOUNTER — Encounter: Payer: Self-pay | Admitting: Family

## 2023-08-14 VITALS — BP 138/85 | Ht 71.0 in | Wt 221.0 lb

## 2023-08-14 DIAGNOSIS — I10 Essential (primary) hypertension: Secondary | ICD-10-CM | POA: Diagnosis not present

## 2023-08-14 DIAGNOSIS — E669 Obesity, unspecified: Secondary | ICD-10-CM

## 2023-08-14 DIAGNOSIS — F17200 Nicotine dependence, unspecified, uncomplicated: Secondary | ICD-10-CM

## 2023-08-14 DIAGNOSIS — I700 Atherosclerosis of aorta: Secondary | ICD-10-CM | POA: Diagnosis not present

## 2023-08-14 DIAGNOSIS — J439 Emphysema, unspecified: Secondary | ICD-10-CM

## 2023-08-14 DIAGNOSIS — G5601 Carpal tunnel syndrome, right upper limb: Secondary | ICD-10-CM | POA: Diagnosis not present

## 2023-08-14 DIAGNOSIS — Z1211 Encounter for screening for malignant neoplasm of colon: Secondary | ICD-10-CM

## 2023-08-14 DIAGNOSIS — N529 Male erectile dysfunction, unspecified: Secondary | ICD-10-CM

## 2023-08-14 MED ORDER — HYDROCHLOROTHIAZIDE 25 MG PO TABS: 25.0000 mg | ORAL_TABLET | Freq: Every day | ORAL | 3 refills | Status: DC

## 2023-08-14 MED ORDER — ROSUVASTATIN CALCIUM 5 MG PO TABS: 5.0000 mg | ORAL_TABLET | Freq: Every day | ORAL | 3 refills | Status: DC

## 2023-08-14 MED ORDER — DICLOFENAC SODIUM 75 MG PO TBEC: 75.0000 mg | DELAYED_RELEASE_TABLET | Freq: Two times a day (BID) | ORAL | 2 refills | Status: DC

## 2023-08-14 MED ORDER — SILDENAFIL CITRATE 100 MG PO TABS: 50.0000 mg | ORAL_TABLET | Freq: Every day | ORAL | 2 refills | Status: DC | PRN

## 2023-08-14 NOTE — Progress Notes (Signed)
Virtual Visit Consent   Bruce [name], you are scheduled for a virtual visit with a [facility] provider today. Just as with appointments in the office, your consent must be obtained to participate. Your consent will be active for this visit and any virtual visit you may have with one of our providers in the next 365 days. If you have a MyChart account, a copy of this consent can be sent to you electronically.  As this is a virtual visit, video technology does not allow for your provider to perform a traditional examination. This may limit your provider's ability to fully assess your condition. If your provider identifies any concerns that need to be evaluated in person or the need to arrange testing (such as labs, EKG, etc.), we will make arrangements to do so. Although advances in technology are sophisticated, we cannot ensure that it will always work on either your end or our end. If the connection with a video visit is poor, the visit may have to be switched to a telephone visit. With either a video or telephone visit, we are not always able to ensure that we have a secure connection.  By engaging in this virtual visit, you consent to the provision of healthcare and authorize for your insurance to be billed (if applicable) for the services provided during this visit. Depending on your insurance coverage, you may receive a charge related to this service.  I need to obtain your verbal consent now. Are you willing to proceed with your visit today? Bruce [name] has provided verbal consent on 08/14/2023 for a virtual visit (video or telephone). [name], FNP  Date: 08/14/2023 11:02 AM  Virtual Visit via Video Note   I, [name], connected with  Bruce [name]  ([id number], [birth year]) on 08/14/23 at 10:55 AM EST by a video-enabled telemedicine application and verified that I am speaking with the correct person using two identifiers.  Location: Patient: Virtual Visit Location  Patient: Other: car Provider: Virtual Visit Location Provider: Home Office   I discussed the limitations of evaluation and management by telemedicine and the availability of in person appointments. The patient expressed understanding and agreed to proceed.    History of Present Illness: Bruce [name] is a 62 y.o. who identifies as a male who was assigned male at birth, and is being seen today for chronic follow up. He lost his wife of 44 years on 10/2021.He has got a friend he goes out to eat with and keeps busy on the weekend.    Has ED and takes Viagra as needed.   He is a current 1-2 pack smoker a day for the last 35 years. Has COPD, but denies any SOB at this time.   He has aortic atherosclerosis and taking Crestor daily.   He is complaining of right  index, middle and thumb numbness that started three weeks ago.  HPI: Hypertension This is a chronic problem. The current episode started more than 1 year ago. The problem has been resolved since onset. The problem is controlled. Associated symptoms include malaise/fatigue. Pertinent negatives include no peripheral edema or shortness of breath. Risk factors for coronary artery disease include male gender. The current treatment provides moderate improvement.  Nicotine Dependence Presents for follow-up visit. His urge triggers include company of smokers. The symptoms have been stable. He smokes 1 pack of cigarettes per day.    Problems:  Patient Active Problem List   Diagnosis Date Noted   Pulmonary emphysema (HCC)  08/14/2023   Aortic atherosclerosis (HCC) 06/24/2022   Adrenal mass, left (HCC) 12/17/2018   Abnormal ECG during exercise stress test 05/20/2018   VSD (ventricular septal defect and aortic arch hypoplasia 05/20/2018   Hypertension 12/18/2017   Heart murmur 10/03/2017   Vitamin D deficiency 03/24/2017   Current smoker 03/21/2017   Obesity (BMI 30-39.9) 03/21/2017    Allergies: No Known Allergies Medications:  Current  Outpatient Medications:    diclofenac (VOLTAREN) 75 MG EC tablet, Take 1 tablet (75 mg total) by mouth 2 (two) times daily., Disp: 60 tablet, Rfl: 2   aspirin EC 81 MG tablet, Take 81 mg by mouth daily. Swallow whole., Disp: , Rfl:    hydrochlorothiazide (HYDRODIURIL) 25 MG tablet, Take 1 tablet (25 mg total) by mouth daily. **NEEDS TO BE SEEN BEFORE NEXT REFILL**, Disp: 90 tablet, Rfl: 3   rosuvastatin (CRESTOR) 5 MG tablet, Take 1 tablet (5 mg total) by mouth daily., Disp: 90 tablet, Rfl: 3   sildenafil (VIAGRA) 100 MG tablet, Take 0.5-1 tablets (50-100 mg total) by mouth daily as needed for erectile dysfunction., Disp: 30 tablet, Rfl: 2   Vitamin D, Ergocalciferol, (DRISDOL) 1.25 MG (50000 UNIT) CAPS capsule, Take 1 capsule (50,000 Units total) by mouth every 7 (seven) days., Disp: 12 capsule, Rfl: 3  Observations/Objective: Patient is well-developed, well-nourished in no acute distress.  Resting comfortably   Head is normocephalic, atraumatic.  No labored breathing.  Speech is clear and coherent with logical content.  Patient is alert and oriented at baseline.    Assessment and Plan: 1. Aortic atherosclerosis (HCC) - rosuvastatin (CRESTOR) 5 MG tablet; Take 1 tablet (5 mg total) by mouth daily.  Dispense: 90 tablet; Refill: 3  2. Primary hypertension - hydrochlorothiazide (HYDRODIURIL) 25 MG tablet; Take 1 tablet (25 mg total) by mouth daily. **NEEDS TO BE SEEN BEFORE NEXT REFILL**  Dispense: 90 tablet; Refill: 3  3. Obesity (BMI 30-39.9)  4. Current smoker  5. Colon cancer screening - Cologuard  6. Pulmonary emphysema, unspecified emphysema type (HCC)  7. Carpal tunnel syndrome of right wrist (Primary) - diclofenac (VOLTAREN) 75 MG EC tablet; Take 1 tablet (75 mg total) by mouth 2 (two) times daily.  Dispense: 60 tablet; Refill: 2  8. Erectile dysfunction, unspecified erectile dysfunction type - sildenafil (VIAGRA) 100 MG tablet; Take 0.5-1 tablets (50-100 mg total) by mouth  daily as needed for erectile dysfunction.  Dispense: 30 tablet; Refill: 2  Pt will make in person visit in next 3 months for labs Continue medications  Start diclofenac BID with food, no other NSAIS Start wearing wrist splint at night and while doing repetitive motions   Follow Up Instructions: I discussed the assessment and treatment plan with the patient. The patient was provided an opportunity to ask questions and all were answered. The patient agreed with the plan and demonstrated an understanding of the instructions.  A copy of instructions were sent to the patient via MyChart unless otherwise noted below.     The patient was advised to call back or seek an in-person evaluation if the symptoms worsen or if the condition fails to improve as anticipated.    [name], FNP

## 2023-09-05 ENCOUNTER — Ambulatory Visit: Payer: BC Managed Care – PPO | Admitting: Family

## 2023-11-10 ENCOUNTER — Telehealth: Payer: Self-pay | Admitting: Family

## 2023-11-10 NOTE — Telephone Encounter (Unsigned)
 Copied from CRM [id number]. Topic: Appointments - Appointment Cancel/Reschedule >> Nov 10, 2023 10:17 AM Gery Pray wrote: Patient/patient representative is calling to reschedule an appointment. Patient calling to reschedule CT Scan due to going out of town. Patient states that he can do before noon on 03/21 or he can do any day before the 21st of March. Please contact patient at [phone].

## 2023-11-11 NOTE — Telephone Encounter (Signed)
 R/C to Patient - Made him aware he was scheduled with [facility] and he would need to call their Office to reschedule Appt.

## 2023-11-12 ENCOUNTER — Other Ambulatory Visit: Payer: Self-pay

## 2023-11-12 DIAGNOSIS — Z122 Encounter for screening for malignant neoplasm of respiratory organs: Secondary | ICD-10-CM

## 2023-11-12 DIAGNOSIS — F17210 Nicotine dependence, cigarettes, uncomplicated: Secondary | ICD-10-CM

## 2023-11-12 DIAGNOSIS — Z87891 Personal history of nicotine dependence: Secondary | ICD-10-CM

## 2023-11-21 ENCOUNTER — Other Ambulatory Visit

## 2023-11-24 ENCOUNTER — Ambulatory Visit
Admission: RE | Admit: 2023-11-24 | Discharge: 2023-11-24 | Disposition: A | Discharge status: Home / Self Care | Source: Ambulatory Visit | Attending: Acute Care | Admitting: Acute Care

## 2023-11-24 DIAGNOSIS — Z122 Encounter for screening for malignant neoplasm of respiratory organs: Secondary | ICD-10-CM

## 2023-11-24 DIAGNOSIS — Z87891 Personal history of nicotine dependence: Secondary | ICD-10-CM | POA: Diagnosis not present

## 2023-11-24 DIAGNOSIS — F17210 Nicotine dependence, cigarettes, uncomplicated: Secondary | ICD-10-CM | POA: Diagnosis not present

## 2023-12-25 ENCOUNTER — Encounter: Payer: Self-pay | Admitting: Family

## 2023-12-25 ENCOUNTER — Ambulatory Visit (INDEPENDENT_AMBULATORY_CARE_PROVIDER_SITE_OTHER): Payer: BC Managed Care – PPO | Admitting: Family

## 2023-12-25 VITALS — BP 136/67 | HR 53 | Temp 98.1°F | Ht 71.0 in | Wt 232.4 lb

## 2023-12-25 DIAGNOSIS — E559 Vitamin D deficiency, unspecified: Secondary | ICD-10-CM

## 2023-12-25 DIAGNOSIS — R351 Nocturia: Secondary | ICD-10-CM

## 2023-12-25 DIAGNOSIS — E669 Obesity, unspecified: Secondary | ICD-10-CM | POA: Diagnosis not present

## 2023-12-25 DIAGNOSIS — I700 Atherosclerosis of aorta: Secondary | ICD-10-CM | POA: Diagnosis not present

## 2023-12-25 DIAGNOSIS — Z0001 Encounter for general adult medical examination with abnormal findings: Secondary | ICD-10-CM | POA: Diagnosis not present

## 2023-12-25 DIAGNOSIS — I10 Essential (primary) hypertension: Secondary | ICD-10-CM

## 2023-12-25 DIAGNOSIS — F17200 Nicotine dependence, unspecified, uncomplicated: Secondary | ICD-10-CM | POA: Diagnosis not present

## 2023-12-25 DIAGNOSIS — J439 Emphysema, unspecified: Secondary | ICD-10-CM

## 2023-12-25 DIAGNOSIS — Z0000 Encounter for general adult medical examination without abnormal findings: Secondary | ICD-10-CM

## 2023-12-25 NOTE — Patient Instructions (Signed)
 Health Maintenance, Male  Adopting a healthy lifestyle and getting preventive care are important in promoting health and wellness. Ask your health care provider about:  The right schedule for you to have regular tests and exams.  Things you can do on your own to prevent diseases and keep yourself healthy.  What should I know about diet, weight, and exercise?  Eat a healthy diet    Eat a diet that includes plenty of vegetables, fruits, low-fat dairy products, and lean protein.  Do not eat a lot of foods that are high in solid fats, added sugars, or sodium.  Maintain a healthy weight  Body mass index (BMI) is a measurement that can be used to identify possible weight problems. It estimates body fat based on height and weight. Your health care provider can help determine your BMI and help you achieve or maintain a healthy weight.  Get regular exercise  Get regular exercise. This is one of the most important things you can do for your health. Most adults should:  Exercise for at least 150 minutes each week. The exercise should increase your heart rate and make you sweat (moderate-intensity exercise).  Do strengthening exercises at least twice a week. This is in addition to the moderate-intensity exercise.  Spend less time sitting. Even light physical activity can be beneficial.  Watch cholesterol and blood lipids  Have your blood tested for lipids and cholesterol at 62 years of age, then have this test every 5 years.  You may need to have your cholesterol levels checked more often if:  Your lipid or cholesterol levels are high.  You are older than 61 years of age.  You are at high risk for heart disease.  What should I know about cancer screening?  Many types of cancers can be detected early and may often be prevented. Depending on your health history and family history, you may need to have cancer screening at various ages. This may include screening for:  Colorectal cancer.  Prostate cancer.  Skin cancer.  Lung  cancer.  What should I know about heart disease, diabetes, and high blood pressure?  Blood pressure and heart disease  High blood pressure causes heart disease and increases the risk of stroke. This is more likely to develop in people who have high blood pressure readings or are overweight.  Talk with your health care provider about your target blood pressure readings.  Have your blood pressure checked:  Every 3-5 years if you are 9-95 years of age.  Every year if you are 85 years old or older.  If you are between the ages of 29 and 29 and are a current or former smoker, ask your health care provider if you should have a one-time screening for abdominal aortic aneurysm (AAA).  Diabetes  Have regular diabetes screenings. This checks your fasting blood sugar level. Have the screening done:  Once every three years after age 23 if you are at a normal weight and have a low risk for diabetes.  More often and at a younger age if you are overweight or have a high risk for diabetes.  What should I know about preventing infection?  Hepatitis B  If you have a higher risk for hepatitis B, you should be screened for this virus. Talk with your health care provider to find out if you are at risk for hepatitis B infection.  Hepatitis C  Blood testing is recommended for:  Everyone born from [birth year] through 1964.  Anyone  with known risk factors for hepatitis C.  Sexually transmitted infections (STIs)  You should be screened each year for STIs, including gonorrhea and chlamydia, if:  You are sexually active and are younger than 62 years of age.  You are older than 62 years of age and your health care provider tells you that you are at risk for this type of infection.  Your sexual activity has changed since you were last screened, and you are at increased risk for chlamydia or gonorrhea. Ask your health care provider if you are at risk.  Ask your health care provider about whether you are at high risk for HIV. Your health care provider  may recommend a prescription medicine to help prevent HIV infection. If you choose to take medicine to prevent HIV, you should first get tested for HIV. You should then be tested every 3 months for as long as you are taking the medicine.  Follow these instructions at home:  Alcohol use  Do not drink alcohol if your health care provider tells you not to drink.  If you drink alcohol:  Limit how much you have to 0-2 drinks a day.  Know how much alcohol is in your drink. In the [place] one drink equals one 12 oz bottle of beer (355 mL), one 5 oz glass of wine (148 mL), or one 1 oz glass of hard liquor (44 mL).  Lifestyle  Do not use any products that contain nicotine or tobacco. These products include cigarettes, chewing tobacco, and vaping devices, such as e-cigarettes. If you need help quitting, ask your health care provider.  Do not use street drugs.  Do not share needles.  Ask your health care provider for help if you need support or information about quitting drugs.  General instructions  Schedule regular health, dental, and eye exams.  Stay current with your vaccines.  Tell your health care provider if:  You often feel depressed.  You have ever been abused or do not feel safe at home.  Summary  Adopting a healthy lifestyle and getting preventive care are important in promoting health and wellness.  Follow your health care provider's instructions about healthy diet, exercising, and getting tested or screened for diseases.  Follow your health care provider's instructions on monitoring your cholesterol and blood pressure.  This information is not intended to replace advice given to you by your health care provider. Make sure you discuss any questions you have with your health care provider.  Document Revised: 01/08/2021 Document Reviewed: 01/08/2021  Elsevier Patient Education  2024 [employer].

## 2023-12-25 NOTE — Progress Notes (Signed)
 Subjective:    Patient ID: Bruce [name], male    DOB: 12-14-1960, 63 y.o.   MRN: [id number]  Chief Complaint  Patient presents with   Medical Management of Chronic Issues   PT presents to the office today for CPE. He lost his wife of 44 years on 10/2021.  He is a current 1-2 pack smoker a day for the last 35 years. Has Emphysema, but denies SOB.    He has aortic atherosclerosis and takes Crestor  5 mg daily.  Hypertension This is a chronic problem. The current episode started more than 1 year ago. The problem has been resolved since onset. The problem is controlled. Pertinent negatives include no malaise/fatigue, peripheral edema or shortness of breath. Risk factors for coronary artery disease include dyslipidemia, male gender, obesity and smoking/tobacco exposure. The current treatment provides moderate improvement. There is no history of heart failure.  Nicotine Dependence Presents for follow-up visit. The symptoms have been stable. He smokes 2 packs of cigarettes per day.      Review of Systems  Constitutional:  Negative for malaise/fatigue.  Respiratory:  Negative for shortness of breath.   All other systems reviewed and are negative.   Family History  Problem Relation Age of Onset   Arthritis Mother    Hypertension Mother    Hyperlipidemia Mother    Cancer Mother        Brain tumor.   Diabetes Mother    Cancer Father    Cancer Brother        Bone   Social History   Socioeconomic History   Marital status: Widowed    Spouse name: Not on file   Number of children: Not on file   Years of education: Not on file   Highest education level: Not on file  Occupational History   Not on file  Tobacco Use   Smoking status: Every Day    Current packs/day: 1.00    Average packs/day: 1 pack/day for 44.0 years (44.0 ttl pk-yrs)    Types: Cigarettes   Smokeless tobacco: Never  Vaping Use   Vaping status: Never Used  Substance and Sexual Activity   Alcohol use: No    Drug use: No   Sexual activity: Yes  Other Topics Concern   Not on file  Social History Narrative   Not on file   Social Drivers of Health   Financial Resource Strain: Not on file  Food Insecurity: Not on file  Transportation Needs: Not on file  Physical Activity: Not on file  Stress: Not on file  Social Connections: Not on file       Objective:   Physical Exam Vitals reviewed.  Constitutional:      General: He is not in acute distress.    Appearance: He is well-developed.  HENT:     Head: Normocephalic.     Right Ear: Tympanic membrane normal.     Left Ear: Tympanic membrane normal.  Eyes:     General:        Right eye: No discharge.        Left eye: No discharge.     Pupils: Pupils are equal, round, and reactive to light.  Neck:     Thyroid: No thyromegaly.  Cardiovascular:     Rate and Rhythm: Normal rate and regular rhythm.     Heart sounds: Murmur heard.  Pulmonary:     Effort: Pulmonary effort is normal. No respiratory distress.     Breath sounds: Wheezing  present.  Abdominal:     General: Bowel sounds are normal. There is no distension.     Palpations: Abdomen is soft.     Tenderness: There is no abdominal tenderness.  Musculoskeletal:        General: No tenderness. Normal range of motion.     Cervical back: Normal range of motion and neck supple.  Skin:    General: Skin is warm and dry.     Findings: No erythema or rash.  Neurological:     Mental Status: He is alert and oriented to person, place, and time.     Cranial Nerves: No cranial nerve deficit.     Deep Tendon Reflexes: Reflexes are normal and symmetric.  Psychiatric:        Behavior: Behavior normal.        Thought Content: Thought content normal.        Judgment: Judgment normal.      BP 136/67   Pulse (!) 53   Temp 98.1 F (36.7 C) (Temporal)   Ht 5\' 11"  (1.803 m)   Wt 232 lb 6.4 oz (105.4 kg)   SpO2 94%   BMI 32.41 kg/m      Assessment & Plan:  Bruce [name] comes in  today with chief complaint of Medical Management of Chronic Issues   Diagnosis and orders addressed:  1. Annual physical exam (Primary) - CBC with Differential/Platelet - CMP14+EGFR - Lipid panel - TSH - PSA, total and free  2. Current smoker - CBC with Differential/Platelet - CMP14+EGFR  3. Obesity (BMI 30-39.9) - CBC with Differential/Platelet - CMP14+EGFR  4. Vitamin D  deficiency - CBC with Differential/Platelet - CMP14+EGFR - VITAMIN D  25 Hydroxy (Vit-D Deficiency, Fractures)  5. Primary hypertension  - CBC with Differential/Platelet - CMP14+EGFR - TSH  6. Aortic atherosclerosis (HCC) - CBC with Differential/Platelet - CMP14+EGFR - TSH  7. Pulmonary emphysema, unspecified emphysema type (HCC) -Smoking cessation discussed  - CBC with Differential/Platelet - CMP14+EGFR  8. Nocturia - CBC with Differential/Platelet - CMP14+EGFR - PSA, total and free    Labs pending Continue current medications  Smoking cessation  Health Maintenance reviewed Diet and exercise encouraged  Follow up plan: 6 months   Tommas Fragmin, FNP

## 2023-12-26 LAB — CBC WITH DIFFERENTIAL/PLATELET
Basophils Absolute: 0.1 10*3/uL (ref 0.0–0.2)
Basos: 1 %
EOS (ABSOLUTE): 0.1 10*3/uL (ref 0.0–0.4)
Eos: 1 %
Hematocrit: 48.1 % (ref 37.5–51.0)
Hemoglobin: 15.8 g/dL (ref 13.0–17.7)
Immature Grans (Abs): 0 10*3/uL (ref 0.0–0.1)
Immature Granulocytes: 0 %
Lymphocytes Absolute: 2 10*3/uL (ref 0.7–3.1)
Lymphs: 31 %
MCH: 28.7 pg (ref 26.6–33.0)
MCHC: 32.8 g/dL (ref 31.5–35.7)
MCV: 88 fL (ref 79–97)
Monocytes Absolute: 0.5 10*3/uL (ref 0.1–0.9)
Monocytes: 8 %
Neutrophils Absolute: 3.7 10*3/uL (ref 1.4–7.0)
Neutrophils: 59 %
Platelets: 217 10*3/uL (ref 150–450)
RBC: 5.5 x10E6/uL (ref 4.14–5.80)
RDW: 13.2 % (ref 11.6–15.4)
WBC: 6.4 10*3/uL (ref 3.4–10.8)

## 2023-12-26 LAB — VITAMIN D 25 HYDROXY (VIT D DEFICIENCY, FRACTURES): Vit D, 25-Hydroxy: 21.2 ng/mL — ABNORMAL LOW (ref 30.0–100.0)

## 2023-12-26 LAB — CMP14+EGFR
ALT: 14 IU/L (ref 0–44)
AST: 13 IU/L (ref 0–40)
Albumin: 4.2 g/dL (ref 3.9–4.9)
Alkaline Phosphatase: 88 IU/L (ref 44–121)
BUN/Creatinine Ratio: 15 (ref 10–24)
BUN: 13 mg/dL (ref 8–27)
Bilirubin Total: 0.4 mg/dL (ref 0.0–1.2)
CO2: 23 mmol/L (ref 20–29)
Calcium: 8.9 mg/dL (ref 8.6–10.2)
Chloride: 102 mmol/L (ref 96–106)
Creatinine, Ser: 0.89 mg/dL (ref 0.76–1.27)
Globulin, Total: 2.1 g/dL (ref 1.5–4.5)
Glucose: 81 mg/dL (ref 70–99)
Potassium: 3.6 mmol/L (ref 3.5–5.2)
Sodium: 139 mmol/L (ref 134–144)
Total Protein: 6.3 g/dL (ref 6.0–8.5)
eGFR: 97 mL/min/{1.73_m2} (ref >59)

## 2023-12-26 LAB — TSH: TSH: 2.37 u[IU]/mL (ref 0.450–4.500)

## 2023-12-26 LAB — LIPID PANEL
Chol/HDL Ratio: 3.7 ratio (ref 0.0–5.0)
Cholesterol, Total: 149 mg/dL (ref 100–199)
HDL: 40 mg/dL (ref >39)
LDL Chol Calc (NIH): 82 mg/dL (ref 0–99)
Triglycerides: 157 mg/dL — ABNORMAL HIGH (ref 0–149)
VLDL Cholesterol Cal: 27 mg/dL (ref 5–40)

## 2023-12-26 LAB — PSA, TOTAL AND FREE
PSA, Free Pct: 15.8 %
PSA, Free: 0.19 ng/mL
Prostate Specific Ag, Serum: 1.2 ng/mL (ref 0.0–4.0)

## 2023-12-29 ENCOUNTER — Other Ambulatory Visit: Payer: Self-pay

## 2023-12-29 DIAGNOSIS — F17210 Nicotine dependence, cigarettes, uncomplicated: Secondary | ICD-10-CM

## 2023-12-29 DIAGNOSIS — Z122 Encounter for screening for malignant neoplasm of respiratory organs: Secondary | ICD-10-CM

## 2023-12-29 DIAGNOSIS — Z87891 Personal history of nicotine dependence: Secondary | ICD-10-CM

## 2023-12-30 ENCOUNTER — Encounter: Payer: Self-pay | Admitting: Family Medicine

## 2024-01-13 ENCOUNTER — Other Ambulatory Visit: Payer: Self-pay | Admitting: Family

## 2024-03-10 ENCOUNTER — Ambulatory Visit (INDEPENDENT_AMBULATORY_CARE_PROVIDER_SITE_OTHER): Admitting: Family Medicine

## 2024-03-10 ENCOUNTER — Encounter: Payer: Self-pay | Admitting: Family Medicine

## 2024-03-10 VITALS — BP 138/75 | HR 60 | Temp 98.2°F | Ht 71.0 in | Wt 233.8 lb

## 2024-03-10 DIAGNOSIS — R8290 Unspecified abnormal findings in urine: Secondary | ICD-10-CM | POA: Diagnosis not present

## 2024-03-10 DIAGNOSIS — L219 Seborrheic dermatitis, unspecified: Secondary | ICD-10-CM

## 2024-03-10 DIAGNOSIS — L739 Follicular disorder, unspecified: Secondary | ICD-10-CM

## 2024-03-10 MED ORDER — DOXYCYCLINE HYCLATE 100 MG PO TABS: 100.0000 mg | ORAL_TABLET | Freq: Two times a day (BID) | ORAL | 0 refills | Status: AC

## 2024-03-10 MED ORDER — KETOCONAZOLE 2 % EX SHAM: MEDICATED_SHAMPOO | CUTANEOUS | 2 refills | Status: DC

## 2024-03-10 NOTE — Patient Instructions (Signed)
 Folliculitis  Folliculitis occurs when hair follicles become inflamed. A hair follicle is a tiny opening in your skin where your hair grows from. This condition often occurs on the scalp, thighs, legs, back, and buttocks but can happen anywhere on the body. What are the causes? A common cause of this condition is an infection from bacteria. The type of folliculitis caused by bacteria can last a long time or go away and come back. The bacteria can live anywhere on your skin. They are often found in the nostrils. Other causes may include: An infection from a fungus. An infection from a virus. Your skin touching some chemicals, such as oils and tars. Shaving or waxing. Greasy ointments or creams put on the skin. What increases the risk? You are more likely to develop this condition if: Your body has a weak disease-fighting system (immune system). You have diabetes. You are obese. What are the signs or symptoms? Symptoms of this condition include: Redness. Soreness. Swelling. Itching. Small white or yellow, itchy spots filled with pus (pustules) that appear over a red area. If the infection goes deep into the follicle, these may turn into a boil (furuncle). A group of boils (carbuncle). These tend to form in hairy, sweaty areas of the body. How is this diagnosed? This condition is diagnosed with a skin exam. Your health care provider may take a sample of one of the pustules or boils to test in a lab. How is this treated? This condition may be treated by: Putting a warm, wet cloth (warm compress) on the affected areas. Taking antibiotics or applying them to the skin. Applying or bathing with a solution that kills germs (antiseptic). Taking an over-the-counter medicine. This can help with itching. Having a procedure to drain pustules or boils. This may be done if a pustule or boil contains a lot of pus or fluid. Having laser hair removal. This may be done when the condition lasts for a  long time. Follow these instructions at home: Managing pain and swelling  If directed, apply heat to the affected area as often as told by your health care provider. Use the heat source that your health care provider recommends, such as a moist heat pack or a heating pad. Place a towel between your skin and the heat source. Leave the heat on for 20-30 minutes. If your skin turns bright red, remove the heat right away to prevent burns. The risk of burns is higher if you cannot feel pain, heat, or cold. General instructions Take over-the-counter and prescription medicines only as told by your health care provider. If you were prescribed antibiotics, take or apply them as told by your health care provider. Do not stop using the antibiotic even if you start to feel better. Check your irritated area every day for signs of infection. Check for: More redness, swelling, or pain. Fluid or blood. Warmth. Pus or a bad smell. Do not shave irritated skin. Keep all follow-up visits. Your health care provider will check if the treatments are helping. Contact a health care provider if: You have a fever. You have any signs of infection. Red streaks are spreading from the affected area. This information is not intended to replace advice given to you by your health care provider. Make sure you discuss any questions you have with your health care provider. Document Revised: 01/22/2022 Document Reviewed: 01/22/2022 Elsevier Patient Education  2024 Elsevier Inc.  Seborrheic Dermatitis, Adult Seborrheic dermatitis is a skin disease that causes red, scaly patches.  It often occurs on the scalp, where it may be called dandruff. The patches may also appear on other parts of the body. Skin patches tend to occur where there are a lot of oil glands in the skin. Areas of the body that may be affected include: The scalp. The face, eyebrows, and ears. The area around a beard. Skin folds of the body. This includes the  armpits, groin, and buttocks. The chest. The condition is often long-lasting (chronic). It may come and go for no known reason. It may be activated by a trigger, such as: Cold weather. Being out in the sun. Stress. Drinking alcohol. What are the causes? The cause of this condition is not known. It may be related to having too much yeast on the skin or changes in how your body's disease-fighting system (immune system) works. What increases the risk? You may be more likely to develop this condition if: You have a weak immune system. You are 33 years old or older. You have other conditions, such as: Human immunodeficiency virus (HIV) or acquired immunodeficiency virus (AIDS). Parkinson's disease. Mood disorders, such as depression. Liver problems. Obesity. What are the signs or symptoms? Symptoms of this condition include: Thick scales on the scalp. Redness on the face or in the armpits. Skin that is flaky. The flakes may be white or yellow. Skin that seems oily or dry but is not helped with moisturizers. Itching or burning in the affected areas. How is this diagnosed? This condition is diagnosed with a medical history and physical exam. A sample of your skin may be tested (skin biopsy). You may need to see a skin specialist (dermatologist). How is this treated? There is no cure for this condition, but treatment can help to manage the symptoms. You may get treatment to remove scales, lower the risk of skin infection, and reduce swelling or itching. Treatment may include: Medicated shampoos, moisturizing creams, or ointments. Creams that reduce skin yeast. Creams that reduce swelling and irritation (steroids). Follow these instructions at home: Skin care Use any medicated shampoo, skin creams, or ointments only as told by your health care provider. Do not use skin products that contain alcohol. Take lukewarm baths or showers. Avoid very hot water . When you are outside, wear a hat  and clothes that block UV light. General instructions Apply over-the-counter and prescription medicines only as told by your health care provider. Learn what triggers your symptoms so you can avoid these things. Use techniques for stress reduction, such as meditation or yoga. Do not drink alcohol if your health care provider tells you not to drink. Keep all follow-up visits. Your health care provider will check your skin to make sure the treatments are helping. Where to find more information American Academy of Dermatology: [employer] Contact a health care provider if: Your symptoms do not get better with treatment. Your symptoms get worse. You have new symptoms. Get help right away if: Your condition quickly gets worse, even with treatment. This information is not intended to replace advice given to you by your health care provider. Make sure you discuss any questions you have with your health care provider. Document Revised: 01/18/2022 Document Reviewed: 01/18/2022 Elsevier Patient Education  2024 [employer].

## 2024-03-10 NOTE — Progress Notes (Signed)
 Subjective: Bruce [name] on neck PCP: Bruce [name] LABOR, FNP Bruce [name] is a 63 y.o. male presenting to clinic today for:  1. Rash on neck Patient reports onset of rash and bumps on the back of his neck after having had a haircut about a month ago.  He utilized over-the-counter Benadryl cream and that seemed to resolve things but it suddenly started back again in the last week or so.  He notes that he does sweat a lot and is outside a lot.  He does not think he has come in contact with any type of irritants including plants as he does have quite a reaction to poison oak.  2.  Urinary odor He reports that he has had a urinary odor for over a month now.  He had urine specimen done with [facility] yesterday but has not received results.  He does not feel like he can urinate today due to having just gone prior to the visit.  He denies any genital lesions or rashes.  He has not come in contact with any STIs as he is in a monogamous married relationship   ROS: Per HPI  No Known Allergies Past Medical History:  Diagnosis Date   Clotting disorder (HCC)    Difficulty clotting blood after tooth extractions or lacerations.   Heart murmur    Hypertension    Meningitis    Had at age 57 years old.   VSD (ventricular septal defect), supracristal     Current Outpatient Medications:    aspirin EC 81 MG tablet, Take 81 mg by mouth daily. Swallow whole., Disp: , Rfl:    doxycycline  (VIBRA -TABS) 100 MG tablet, Take 1 tablet (100 mg total) by mouth 2 (two) times daily for 10 days., Disp: 20 tablet, Rfl: 0   hydrochlorothiazide  (HYDRODIURIL ) 25 MG tablet, Take 1 tablet (25 mg total) by mouth daily. **NEEDS TO BE SEEN BEFORE NEXT REFILL**, Disp: 90 tablet, Rfl: 3   ketoconazole  (NIZORAL ) 2 % shampoo, Lather affected areas and leave on for 10 mins. Then rinse.  Use twice weekly until rash resolves., Disp: 120 mL, Rfl: 2   Vitamin D , Ergocalciferol , (DRISDOL ) 1.25 MG (50000 UNIT) CAPS capsule, TAKE 1 CAPSULE  (50,000 UNITS TOTAL) BY MOUTH EVERY 7 (SEVEN) DAYS, Disp: 12 capsule, Rfl: 1   diclofenac  (VOLTAREN ) 75 MG EC tablet, Take 1 tablet (75 mg total) by mouth 2 (two) times daily. (Patient not taking: Reported on 03/10/2024), Disp: 60 tablet, Rfl: 2   rosuvastatin  (CRESTOR ) 5 MG tablet, Take 1 tablet (5 mg total) by mouth daily. (Patient not taking: Reported on 03/10/2024), Disp: 90 tablet, Rfl: 3   sildenafil  (VIAGRA ) 100 MG tablet, Take 0.5-1 tablets (50-100 mg total) by mouth daily as needed for erectile dysfunction. (Patient not taking: Reported on 03/10/2024), Disp: 30 tablet, Rfl: 2 Social History   Socioeconomic History   Marital status: Widowed    Spouse name: Not on file   Number of children: Not on file   Years of education: Not on file   Highest education level: Not on file  Occupational History   Not on file  Tobacco Use   Smoking status: Every Day    Current packs/day: 1.00    Average packs/day: 1 pack/day for 44.0 years (44.0 ttl pk-yrs)    Types: Cigarettes   Smokeless tobacco: Never  Vaping Use   Vaping status: Never Used  Substance and Sexual Activity   Alcohol use: No   Drug use: No   Sexual  activity: Yes  Other Topics Concern   Not on file  Social History Narrative   Not on file   Social Drivers of Health   Financial Resource Strain: Not on file  Food Insecurity: Not on file  Transportation Needs: Not on file  Physical Activity: Not on file  Stress: Not on file  Social Connections: Not on file  Intimate Partner Violence: Not on file   Family History  Problem Relation Age of Onset   Arthritis Mother    Hypertension Mother    Hyperlipidemia Mother    Cancer Mother        Brain tumor.   Diabetes Mother    Cancer Father    Cancer Brother        Bone    Objective: Office vital signs reviewed. BP 138/75   Pulse 60   Temp 98.2 F (36.8 C) (Temporal)   Ht 5' 11 (1.803 m)   Wt 233 lb 12.8 oz (106.1 kg)   SpO2 95%   BMI 32.61 kg/m   Physical  Examination:  General: Awake, alert, nontoxic male nourished, No acute distress Skin: Follicular inflammation noted along the posterior neck.  He also has dried, flaking skin noted within the lower scalp area. GU: No CVA tenderness palpation.  Assessment/ Plan: 63 y.o. male   Folliculitis - Plan: doxycycline  (VIBRA -TABS) 100 MG tablet  Seborrheic dermatitis of scalp - Plan: ketoconazole  (NIZORAL ) 2 % shampoo  Bad odor of urine  Clinically consistent with folliculitis.  Doxycycline  prescribed.  Caution need for sun protectant, consumption with food to avoid nausea.  Could consider placing him on clindamycin pledgets going forward if he has recurrence.  Ketoconazole  shampoo ordered for seborrheic dermatitis of the scalp.  Discussed how to utilize medication.  He will contact us  if he has any abnormalities within the urine specimen.  Glad to recollect this for him if he is not able to access those results.  We discussed need for increased water  consumption and as he primarily consumes caffeine during the day and sodas.  Also discussed consideration for urinary tract probiotic and/or cranberry tablets   Bruce [name] Fielding, DO [facility] [phone]

## 2024-03-24 ENCOUNTER — Other Ambulatory Visit: Payer: Self-pay | Admitting: Family Medicine

## 2024-03-24 DIAGNOSIS — L739 Follicular disorder, unspecified: Secondary | ICD-10-CM

## 2024-03-26 ENCOUNTER — Other Ambulatory Visit: Payer: Self-pay | Admitting: Family Medicine

## 2024-03-26 DIAGNOSIS — L739 Follicular disorder, unspecified: Secondary | ICD-10-CM

## 2024-06-25 ENCOUNTER — Ambulatory Visit: Admitting: Family

## 2024-06-25 ENCOUNTER — Encounter: Payer: Self-pay | Admitting: Family

## 2024-06-25 VITALS — BP 138/68 | HR 60 | Temp 97.5°F | Ht 71.0 in | Wt 227.0 lb

## 2024-06-25 DIAGNOSIS — F17200 Nicotine dependence, unspecified, uncomplicated: Secondary | ICD-10-CM | POA: Diagnosis not present

## 2024-06-25 DIAGNOSIS — E669 Obesity, unspecified: Secondary | ICD-10-CM

## 2024-06-25 DIAGNOSIS — R011 Cardiac murmur, unspecified: Secondary | ICD-10-CM | POA: Diagnosis not present

## 2024-06-25 DIAGNOSIS — I700 Atherosclerosis of aorta: Secondary | ICD-10-CM

## 2024-06-25 DIAGNOSIS — I10 Essential (primary) hypertension: Secondary | ICD-10-CM | POA: Diagnosis not present

## 2024-06-25 DIAGNOSIS — J439 Emphysema, unspecified: Secondary | ICD-10-CM

## 2024-06-25 LAB — CMP14+EGFR
ALT: 10 IU/L (ref 0–44)
AST: 13 IU/L (ref 0–40)
Albumin: 4.1 g/dL (ref 3.9–4.9)
Alkaline Phosphatase: 87 IU/L (ref 47–123)
BUN/Creatinine Ratio: 15 (ref 10–24)
BUN: 14 mg/dL (ref 8–27)
Bilirubin Total: 0.4 mg/dL (ref 0.0–1.2)
CO2: 25 mmol/L (ref 20–29)
Calcium: 9.1 mg/dL (ref 8.6–10.2)
Chloride: 101 mmol/L (ref 96–106)
Creatinine, Ser: 0.93 mg/dL (ref 0.76–1.27)
Globulin, Total: 2.2 g/dL (ref 1.5–4.5)
Glucose: 103 mg/dL — ABNORMAL HIGH (ref 70–99)
Potassium: 3.9 mmol/L (ref 3.5–5.2)
Sodium: 138 mmol/L (ref 134–144)
Total Protein: 6.3 g/dL (ref 6.0–8.5)
eGFR: 93 mL/min/1.73 (ref >59)

## 2024-06-25 MED ORDER — HYDROCHLOROTHIAZIDE 25 MG PO TABS: 25.0000 mg | ORAL_TABLET | Freq: Every day | ORAL | 3 refills | Status: AC

## 2024-06-25 NOTE — Patient Instructions (Signed)
 Health Maintenance, Male  Adopting a healthy lifestyle and getting preventive care are important in promoting health and wellness. Ask your health care provider about:  The right schedule for you to have regular tests and exams.  Things you can do on your own to prevent diseases and keep yourself healthy.  What should I know about diet, weight, and exercise?  Eat a healthy diet    Eat a diet that includes plenty of vegetables, fruits, low-fat dairy products, and lean protein.  Do not eat a lot of foods that are high in solid fats, added sugars, or sodium.  Maintain a healthy weight  Body mass index (BMI) is a measurement that can be used to identify possible weight problems. It estimates body fat based on height and weight. Your health care provider can help determine your BMI and help you achieve or maintain a healthy weight.  Get regular exercise  Get regular exercise. This is one of the most important things you can do for your health. Most adults should:  Exercise for at least 150 minutes each week. The exercise should increase your heart rate and make you sweat (moderate-intensity exercise).  Do strengthening exercises at least twice a week. This is in addition to the moderate-intensity exercise.  Spend less time sitting. Even light physical activity can be beneficial.  Watch cholesterol and blood lipids  Have your blood tested for lipids and cholesterol at 62 years of age, then have this test every 5 years.  You may need to have your cholesterol levels checked more often if:  Your lipid or cholesterol levels are high.  You are older than 62 years of age.  You are at high risk for heart disease.  What should I know about cancer screening?  Many types of cancers can be detected early and may often be prevented. Depending on your health history and family history, you may need to have cancer screening at various ages. This may include screening for:  Colorectal cancer.  Prostate cancer.  Skin cancer.  Lung  cancer.  What should I know about heart disease, diabetes, and high blood pressure?  Blood pressure and heart disease  High blood pressure causes heart disease and increases the risk of stroke. This is more likely to develop in people who have high blood pressure readings or are overweight.  Talk with your health care provider about your target blood pressure readings.  Have your blood pressure checked:  Every 3-5 years if you are 24-52 years of age.  Every year if you are 3 years old or older.  If you are between the ages of 60 and 72 and are a current or former smoker, ask your health care provider if you should have a one-time screening for abdominal aortic aneurysm (AAA).  Diabetes  Have regular diabetes screenings. This checks your fasting blood sugar level. Have the screening done:  Once every three years after age 66 if you are at a normal weight and have a low risk for diabetes.  More often and at a younger age if you are overweight or have a high risk for diabetes.  What should I know about preventing infection?  Hepatitis B  If you have a higher risk for hepatitis B, you should be screened for this virus. Talk with your health care provider to find out if you are at risk for hepatitis B infection.  Hepatitis C  Blood testing is recommended for:  Everyone born from [birth year] through 1964.  Anyone  with known risk factors for hepatitis C.  Sexually transmitted infections (STIs)  You should be screened each year for STIs, including gonorrhea and chlamydia, if:  You are sexually active and are younger than 62 years of age.  You are older than 62 years of age and your health care provider tells you that you are at risk for this type of infection.  Your sexual activity has changed since you were last screened, and you are at increased risk for chlamydia or gonorrhea. Ask your health care provider if you are at risk.  Ask your health care provider about whether you are at high risk for HIV. Your health care provider  may recommend a prescription medicine to help prevent HIV infection. If you choose to take medicine to prevent HIV, you should first get tested for HIV. You should then be tested every 3 months for as long as you are taking the medicine.  Follow these instructions at home:  Alcohol use  Do not drink alcohol if your health care provider tells you not to drink.  If you drink alcohol:  Limit how much you have to 0-2 drinks a day.  Know how much alcohol is in your drink. In the [place] one drink equals one 12 oz bottle of beer (355 mL), one 5 oz glass of wine (148 mL), or one 1 oz glass of hard liquor (44 mL).  Lifestyle  Do not use any products that contain nicotine or tobacco. These products include cigarettes, chewing tobacco, and vaping devices, such as e-cigarettes. If you need help quitting, ask your health care provider.  Do not use street drugs.  Do not share needles.  Ask your health care provider for help if you need support or information about quitting drugs.  General instructions  Schedule regular health, dental, and eye exams.  Stay current with your vaccines.  Tell your health care provider if:  You often feel depressed.  You have ever been abused or do not feel safe at home.  Summary  Adopting a healthy lifestyle and getting preventive care are important in promoting health and wellness.  Follow your health care provider's instructions about healthy diet, exercising, and getting tested or screened for diseases.  Follow your health care provider's instructions on monitoring your cholesterol and blood pressure.  This information is not intended to replace advice given to you by your health care provider. Make sure you discuss any questions you have with your health care provider.  Document Revised: 01/08/2021 Document Reviewed: 01/08/2021  Elsevier Patient Education  2024 [employer].

## 2024-06-25 NOTE — Progress Notes (Signed)
 Subjective:    Patient ID: Bruce [name], male    DOB: Nov 10, 1960, 63 y.o.   MRN: [id number]  Chief Complaint  Patient presents with   Medical Management of Chronic Issues    No concerns not fasting    PT presents to the office today for chronic follow up.   He lost his wife of 44 years on 10/2021.  He is a current 1-2 pack smoker a day for the last 35 years. Has Emphysema, but denies SOB.    He has aortic atherosclerosis and takes Crestor  5 mg daily.  Hypertension This is a chronic problem. The current episode started more than 1 year ago. The problem has been waxing and waning since onset. The problem is uncontrolled. Pertinent negatives include no malaise/fatigue, peripheral edema or shortness of breath. Risk factors for coronary artery disease include dyslipidemia, male gender, obesity and smoking/tobacco exposure. The current treatment provides moderate improvement. There is no history of heart failure.  Nicotine Dependence Presents for follow-up visit. The symptoms have been stable. He smokes 2 packs of cigarettes per day.  Hyperlipidemia This is a chronic problem. The current episode started more than 1 year ago. The problem is uncontrolled. Pertinent negatives include no shortness of breath. Current antihyperlipidemic treatment includes diet change. The current treatment provides no improvement of lipids. Risk factors for coronary artery disease include hypertension, a sedentary lifestyle, male sex and dyslipidemia.      Review of Systems  Constitutional:  Negative for malaise/fatigue.  Respiratory:  Negative for shortness of breath.   All other systems reviewed and are negative.   Family History  Problem Relation Age of Onset   Arthritis Mother    Hypertension Mother    Hyperlipidemia Mother    Cancer Mother        Brain tumor.   Diabetes Mother    Cancer Father    Cancer Brother        Bone   Social History   Socioeconomic History   Marital status: Widowed     Spouse name: Not on file   Number of children: Not on file   Years of education: Not on file   Highest education level: Not on file  Occupational History   Not on file  Tobacco Use   Smoking status: Every Day    Current packs/day: 1.00    Average packs/day: 1 pack/day for 44.0 years (44.0 ttl pk-yrs)    Types: Cigarettes   Smokeless tobacco: Never  Vaping Use   Vaping status: Never Used  Substance and Sexual Activity   Alcohol use: No   Drug use: No   Sexual activity: Yes  Other Topics Concern   Not on file  Social History Narrative   Not on file   Social Drivers of Health   Financial Resource Strain: Low Risk  (06/25/2024)   Overall Financial Resource Strain (CARDIA)    Difficulty of Paying Living Expenses: Not hard at all  Food Insecurity: No Food Insecurity (06/25/2024)   Hunger Vital Sign    Worried About Running Out of Food in the Last Year: Never true    Ran Out of Food in the Last Year: Never true  Transportation Needs: No Transportation Needs (06/25/2024)   PRAPARE - Administrator, Civil Service (Medical): No    Lack of Transportation (Non-Medical): No  Physical Activity: Insufficiently Active (06/25/2024)   Exercise Vital Sign    Days of Exercise per Week: 2 days    Minutes  of Exercise per Session: 20 min  Stress: No Stress Concern Present (06/25/2024)   [employer] of Occupational Health - Occupational Stress Questionnaire    Feeling of Stress: Not at all  Social Connections: Moderately Isolated (06/25/2024)   Social Connection and Isolation Panel    Frequency of Communication with Friends and Family: More than three times a week    Frequency of Social Gatherings with Friends and Family: Twice a week    Attends Religious Services: More than 4 times per year    Active Member of [employer] or Organizations: No    Attends Banker Meetings: Not on file    Marital Status: Widowed       Objective:   Physical Exam Vitals  reviewed.  Constitutional:      General: He is not in acute distress.    Appearance: He is well-developed.  HENT:     Head: Normocephalic.     Right Ear: There is impacted cerumen.     Left Ear: Tympanic membrane normal.  Eyes:     General:        Right eye: No discharge.        Left eye: No discharge.     Pupils: Pupils are equal, round, and reactive to light.  Neck:     Thyroid: No thyromegaly.  Cardiovascular:     Rate and Rhythm: Normal rate and regular rhythm.     Heart sounds: Murmur heard.  Pulmonary:     Effort: Pulmonary effort is normal. No respiratory distress.     Breath sounds: Wheezing present.  Abdominal:     General: Bowel sounds are normal. There is no distension.     Palpations: Abdomen is soft.     Tenderness: There is no abdominal tenderness.  Musculoskeletal:        General: No tenderness. Normal range of motion.     Cervical back: Normal range of motion and neck supple.  Skin:    General: Skin is warm and dry.     Findings: No erythema or rash.  Neurological:     Mental Status: He is alert and oriented to person, place, and time.     Cranial Nerves: No cranial nerve deficit.     Deep Tendon Reflexes: Reflexes are normal and symmetric.  Psychiatric:        Behavior: Behavior normal.        Thought Content: Thought content normal.        Judgment: Judgment normal.      BP (!) 162/85   Pulse 60   Temp (!) 97.5 F (36.4 C) (Temporal)   Ht 5' 11 (1.803 m)   Wt 227 lb (103 kg)   SpO2 95%   BMI 31.66 kg/m      Assessment & Plan:  Bruce [name] comes in today with chief complaint of Medical Management of Chronic Issues (No concerns not fasting )   Diagnosis and orders addressed:  1. Primary hypertension - hydrochlorothiazide  (HYDRODIURIL ) 25 MG tablet; Take 1 tablet (25 mg total) by mouth daily.  Dispense: 90 tablet; Refill: 3 - CMP14+EGFR  2. Aortic atherosclerosis (Primary) - CMP14+EGFR  3. Current smoker - CMP14+EGFR  4. Heart  murmur - CMP14+EGFR  5. Obesity (BMI 30-39.9) - CMP14+EGFR  6. Pulmonary emphysema (HCC) - CMP14+EGFR    Labs pending Continue current medications  Smoking cessation  Health Maintenance reviewed Diet and exercise encouraged  Follow up plan: 6 months   Bari Learn, FNP

## 2024-06-28 ENCOUNTER — Ambulatory Visit: Payer: Self-pay | Admitting: Family

## 2024-08-24 ENCOUNTER — Other Ambulatory Visit: Payer: Self-pay | Admitting: Family

## 2024-08-24 DIAGNOSIS — N529 Male erectile dysfunction, unspecified: Secondary | ICD-10-CM

## 2024-12-21 ENCOUNTER — Ambulatory Visit: Payer: Self-pay | Admitting: Family
